# Patient Record
Sex: Female | Born: 1960 | Race: White | Hispanic: No | Marital: Married | Smoking: Former smoker
Health system: Southern US, Community
[De-identification: ages and names within clinical notes are randomized; demographics above are authoritative.]

## PROBLEM LIST (undated history)

## (undated) DIAGNOSIS — G4700 Insomnia, unspecified: Secondary | ICD-10-CM

## (undated) DIAGNOSIS — F909 Attention-deficit hyperactivity disorder, unspecified type: Secondary | ICD-10-CM

## (undated) DIAGNOSIS — M1990 Unspecified osteoarthritis, unspecified site: Secondary | ICD-10-CM

## (undated) DIAGNOSIS — I10 Essential (primary) hypertension: Secondary | ICD-10-CM

## (undated) HISTORY — PX: URETHRAL FISTULA REPAIR: SHX2619

## (undated) HISTORY — DX: Unspecified osteoarthritis, unspecified site: M19.90

## (undated) HISTORY — PX: SHOULDER SURGERY: SHX246

## (undated) HISTORY — PX: CATARACT EXTRACTION: SUR2

## (undated) HISTORY — PX: CERVICAL FUSION: SHX112

## (undated) HISTORY — PX: TONSILLECTOMY: SUR1361

## (undated) HISTORY — PX: WISDOM TOOTH EXTRACTION: SHX21

## (undated) HISTORY — PX: BREAST ENHANCEMENT SURGERY: SHX7

## (undated) HISTORY — DX: Insomnia, unspecified: G47.00

## (undated) HISTORY — PX: HERNIA REPAIR: SHX51

## (undated) HISTORY — PX: LUMBAR FUSION: SHX111

## (undated) HISTORY — PX: RECONSTRUCTION OF EYELID: SHX6576

---

## 1997-10-20 ENCOUNTER — Other Ambulatory Visit: Admission: RE | Admit: 1997-10-20 | Discharge: 1997-10-20 | Payer: Self-pay | Admitting: Obstetrics and Gynecology

## 1998-09-13 ENCOUNTER — Other Ambulatory Visit: Admission: RE | Admit: 1998-09-13 | Discharge: 1998-09-13 | Payer: Self-pay | Admitting: Obstetrics and Gynecology

## 1998-10-15 ENCOUNTER — Ambulatory Visit (HOSPITAL_COMMUNITY): Admission: RE | Admit: 1998-10-15 | Discharge: 1998-10-15 | Payer: Self-pay | Admitting: *Deleted

## 1998-10-15 ENCOUNTER — Encounter: Payer: Self-pay | Admitting: *Deleted

## 1999-07-25 ENCOUNTER — Ambulatory Visit (HOSPITAL_COMMUNITY): Admission: RE | Admit: 1999-07-25 | Discharge: 1999-07-25 | Payer: Self-pay | Admitting: Obstetrics and Gynecology

## 1999-10-31 ENCOUNTER — Ambulatory Visit (HOSPITAL_COMMUNITY): Admission: RE | Admit: 1999-10-31 | Discharge: 1999-10-31 | Payer: Self-pay | Admitting: Obstetrics and Gynecology

## 2000-01-26 ENCOUNTER — Inpatient Hospital Stay (HOSPITAL_COMMUNITY): Admission: AD | Admit: 2000-01-26 | Discharge: 2000-01-29 | Payer: Self-pay | Admitting: Obstetrics and Gynecology

## 2000-01-26 ENCOUNTER — Encounter (INDEPENDENT_AMBULATORY_CARE_PROVIDER_SITE_OTHER): Payer: Self-pay

## 2000-02-22 ENCOUNTER — Other Ambulatory Visit: Admission: RE | Admit: 2000-02-22 | Discharge: 2000-02-22 | Payer: Self-pay | Admitting: Obstetrics & Gynecology

## 2000-06-28 ENCOUNTER — Observation Stay (HOSPITAL_COMMUNITY): Admission: RE | Admit: 2000-06-28 | Discharge: 2000-06-29 | Payer: Self-pay | Admitting: Obstetrics and Gynecology

## 2000-06-28 ENCOUNTER — Encounter (INDEPENDENT_AMBULATORY_CARE_PROVIDER_SITE_OTHER): Payer: Self-pay

## 2001-10-10 ENCOUNTER — Emergency Department (HOSPITAL_COMMUNITY): Admission: EM | Admit: 2001-10-10 | Discharge: 2001-10-10 | Payer: Self-pay | Admitting: Emergency Medicine

## 2002-07-03 ENCOUNTER — Other Ambulatory Visit: Admission: RE | Admit: 2002-07-03 | Discharge: 2002-07-03 | Payer: Self-pay | Admitting: Obstetrics and Gynecology

## 2002-10-14 ENCOUNTER — Other Ambulatory Visit: Admission: RE | Admit: 2002-10-14 | Discharge: 2002-10-14 | Payer: Self-pay | Admitting: Obstetrics and Gynecology

## 2003-02-12 ENCOUNTER — Other Ambulatory Visit: Admission: RE | Admit: 2003-02-12 | Discharge: 2003-02-12 | Payer: Self-pay | Admitting: Obstetrics and Gynecology

## 2003-07-13 ENCOUNTER — Other Ambulatory Visit: Admission: RE | Admit: 2003-07-13 | Discharge: 2003-07-13 | Payer: Self-pay | Admitting: Obstetrics and Gynecology

## 2004-12-27 ENCOUNTER — Other Ambulatory Visit: Admission: RE | Admit: 2004-12-27 | Discharge: 2004-12-27 | Payer: Self-pay | Admitting: Obstetrics and Gynecology

## 2005-08-11 ENCOUNTER — Encounter: Admission: RE | Admit: 2005-08-11 | Discharge: 2005-08-11 | Payer: Self-pay | Admitting: Neurosurgery

## 2005-12-09 ENCOUNTER — Encounter: Admission: RE | Admit: 2005-12-09 | Discharge: 2005-12-09 | Payer: Self-pay | Admitting: Neurosurgery

## 2006-01-02 ENCOUNTER — Ambulatory Visit (HOSPITAL_COMMUNITY): Admission: RE | Admit: 2006-01-02 | Discharge: 2006-01-03 | Payer: Self-pay | Admitting: Neurosurgery

## 2006-02-07 ENCOUNTER — Encounter: Admission: RE | Admit: 2006-02-07 | Discharge: 2006-02-07 | Payer: Self-pay | Admitting: Sports Medicine

## 2006-02-28 ENCOUNTER — Encounter: Admission: RE | Admit: 2006-02-28 | Discharge: 2006-02-28 | Payer: Self-pay | Admitting: Sports Medicine

## 2006-06-18 ENCOUNTER — Encounter: Admission: RE | Admit: 2006-06-18 | Discharge: 2006-06-18 | Payer: Self-pay | Admitting: Neurosurgery

## 2008-01-09 ENCOUNTER — Encounter: Admission: RE | Admit: 2008-01-09 | Discharge: 2008-01-09 | Payer: Self-pay | Admitting: Neurosurgery

## 2009-09-30 ENCOUNTER — Ambulatory Visit (HOSPITAL_COMMUNITY): Admission: RE | Admit: 2009-09-30 | Discharge: 2009-09-30 | Payer: Self-pay | Admitting: Obstetrics and Gynecology

## 2010-01-04 ENCOUNTER — Encounter: Admission: RE | Admit: 2010-01-04 | Discharge: 2010-01-04 | Payer: Self-pay | Admitting: Emergency Medicine

## 2010-01-05 ENCOUNTER — Encounter: Admission: RE | Admit: 2010-01-05 | Discharge: 2010-01-05 | Payer: Self-pay | Admitting: Emergency Medicine

## 2010-02-26 ENCOUNTER — Encounter: Payer: Self-pay | Admitting: Neurosurgery

## 2010-03-15 ENCOUNTER — Other Ambulatory Visit: Payer: Self-pay | Admitting: Internal Medicine

## 2010-03-16 ENCOUNTER — Other Ambulatory Visit: Payer: Self-pay | Admitting: Internal Medicine

## 2010-03-16 DIAGNOSIS — M545 Low back pain, unspecified: Secondary | ICD-10-CM

## 2010-03-16 DIAGNOSIS — M79606 Pain in leg, unspecified: Secondary | ICD-10-CM

## 2010-03-21 ENCOUNTER — Ambulatory Visit
Admission: RE | Admit: 2010-03-21 | Discharge: 2010-03-21 | Disposition: A | Discharge status: Home / Self Care | Payer: BC Managed Care – PPO | Source: Ambulatory Visit | Attending: Internal Medicine | Admitting: Internal Medicine

## 2010-03-21 DIAGNOSIS — M79606 Pain in leg, unspecified: Secondary | ICD-10-CM

## 2010-03-21 DIAGNOSIS — M545 Low back pain, unspecified: Secondary | ICD-10-CM

## 2010-04-21 LAB — CBC
Hemoglobin: 14 g/dL (ref 12.0–15.0)
MCH: 32.3 pg (ref 26.0–34.0)
MCV: 96 fL (ref 78.0–100.0)
RBC: 4.34 MIL/uL (ref 3.87–5.11)

## 2010-06-24 NOTE — Op Note (Signed)
Beverly Dougherty, Beverly Dougherty              ACCOUNT NO.:  [id number]   MEDICAL RECORD NO.:  [id number]          PATIENT TYPE:  AMB   LOCATION:  SDS                          FACILITY:  [facility]   PHYSICIAN:  Reinaldo Meeker, M.D. DATE OF BIRTH:  09-Jul-1960   DATE OF PROCEDURE:  01/02/2006  DATE OF DISCHARGE:                                 OPERATIVE REPORT   PREOPERATIVE DIAGNOSIS:  Herniated disk, C6-7 left.   POSTOPERATIVE DIAGNOSIS:  Herniated disk, C6-7 left.   PROCEDURE:  C6-7 anterior cervical diskectomy with bone bank fusion followed  by the Mystique anterior cervical plating.   SURGEON:  Reinaldo Meeker, M.D.   ASSISTANT:  Dr. Julio Sicks.   PROCEDURE IN DETAIL:  After placed in supine position, 5 pounds halter  traction, the patient's neck was prepped and draped in the usual sterile  fashion.  Localizing fluoroscopy was used prior to incision to identify the  appropriate level.  The transverse incision was made in the right anterior  neck starting at the midline and headed towards the medial aspect of the  sternocleidomastoid muscle.  The platysma muscle was incised transversely.  The natural fascial plane between the strap muscles medially and  sternocleidomastoid laterally was identified and followed down to the  anterior aspect of the cervical spine.  Longus coli muscles were identified,  split in the midline stripped away bilaterally with Management consultant.  Self-retaining retractor was placed for exposure and x-rays  showed approach to the appropriate level.  Using a 15 blade the annulus of  the disk at C6-7 was incised.  Using pituitary rongeurs and curettes,  approximately 90% of the disk material was removed.  High-speed drill was  used to widen the interspace.  Microscope was draped brought the field and  used for the remainder of the case.  Using microsection technique, the  remainder of the disk material down the post longitudinal ligament was  removed.   Ligament was then incised transversely and the cut edge was  removed with Kerrison punch.  Large amount of herniated disk material was  identified towards the left side, compressing the C7 nerve root and this was  removed until the C7 nerve root was well decompressed as it went out its  foramen.  Similar decompression was then carried out.  The width of the  spinal dura towards the proximal foramen on the right.  At this time,  inspection was carried out all directions for any evidence of residual  compression and none could be identified.  Large amounts of irrigation were  carried out and any bleeding controlled bipolar coagulation and Gelfoam.  Measurements were taken and a 7 mm bone bank plugs reconstituted.  After  irrigating once more and confirming hemostasis the plug was impacted  difficulty and fluoroscopy showed it to be good position.  An appropriate  length Mystique anterior cervical plate was then chosen by using a template.  Under fluoroscopic guidance, drill holes were placed followed by tapping and  placing 13 mm screws x4 until the screws were secured in the plate.  Irrigation was  then carried out once more final fluoroscopy showed the plate  screws and plug to all be in good position.  Any bleeding was  controlled bipolar coagulation.  Wound was then closed with interrupted  Vicryl on the platysma muscle, inverted 5-0 PDS on the subcuticular layer  and Steri-Strips on the skin.  A sterile dressing and soft collar were  applied.  The patient was extubated and taken to recovery room in stable  condition.           ______________________________  Reinaldo Meeker, M.D.     ROK/MEDQ  D:  01/02/2006  T:  01/02/2006  Job:  [id number]

## 2010-06-24 NOTE — Discharge Summary (Signed)
[facility]  Patient:    Beverly Dougherty, Beverly Dougherty                     MRN: [id number] Adm. Date:  [id number] Disc. Date: [id number] Attending:  Miguel Aschoff Dictator:   Leilani Able, P.A.                           Discharge Summary  FINAL DIAGNOSES:              Vacuum assisted vaginal delivery of a female infant with Apgars of 4 and 9.  HISTORY:                      This 50 year old G1, P0 was admitted for induction at 40 2/7 weeks.  Patient was admitted on the 20th for Cytotec.  By the next morning patient was having good labor with Cytotec.  Her cervix was already 3-4 cm dilated, 100% effaced, at a 0 station.  AROM was performed which did show thick meconium and amnio-infusion was done.  Patient dilated to complete and complete.  Decelerations were starting to be noted in the second stage of labor and at this point a vacuum extractor was placed.  Patient had delivery of a 6 pound 10 ounce female infant with Apgars of 4 and 9 over a second degree perineal laceration.  Pediatricians were in attendance.  There was a long cord noted but the delivery went without complications.  Patients postpartum course was benign without significant fevers.  She was felt ready for discharge on postpartum #2.  Was sent home on a regular diet.  Told to decrease activities.  Told to continue prenatal vitamins.  Was given over-the-counter pain medicines and was told to follow-up in the office in four weeks. DD:  02/27/00 TD:  02/27/00 Job: [id number]

## 2010-06-24 NOTE — Op Note (Signed)
[facility]  Patient:    JANEVA, PEASTER                     MRN: [id number] Proc. Date: 06/28/00 Adm. Date:  [id number] Disc. Date: [id number] Attending:  Osborn Coho                           Operative Report  PREOPERATIVE DIAGNOSES:       1. Symptomatic rectovaginal fistula.                               2. Dyspareunia.  POSTOPERATIVE DIAGNOSES:      1. Symptomatic rectovaginal fistula.                               2. Dyspareunia.  OPERATION:                    Repair of rectovaginal fistula.  SURGEON:                      Mark E. Dareen Piano, M.D.  ANESTHESIA:                   General.  ANTIBIOTICS:                  Ancef 1 g.  DRAINS:                       None.  ESTIMATED BLOOD LOSS:         50 cc.  COMPLICATIONS:                None.  SPECIMENS:                    None.  DESCRIPTION OF PROCEDURE:     The patient was taken to the operating room where general anesthetic was administered without complications.  She was then placed in the dorsolithotomy position and prepped with Hibiclens.  Her bladder was drained with a red rubber catheter.  She was then draped in the usual fashion for this procedure.  Her fistula was identified in the perineum in the midline approximately half way between the anus and the vestibule.   At this point, lacrimal probes were used in attempt to probe the fistula.  This was unsuccessful.  At that point, the fistula was circumscribed and followed down to an area just posterior to the anal sphincter.  At this point, an area in the rectum was opened and the fistula excised.  The rectal mucosa was then closed in three layers of 0 chromic suture.  The perirectal fascia was reapproximated in the midline using 3-0 chromic in a running fashion.  Several layers of 4-0 Vicryl suture were also placed followed by a running 4-0 Rapide suture to close the remaining area in a fashion similar to an  episiotomy repair.  At this point the procedure was concluded.  The patient was taken to the recovery room in stable condition.  Instrument and lap counts were correct x 1. DD:  06/29/00 TD:  06/30/00 Job: [id number]

## 2011-03-29 ENCOUNTER — Other Ambulatory Visit: Payer: Self-pay | Admitting: Family Medicine

## 2011-03-29 DIAGNOSIS — M7070 Other bursitis of hip, unspecified hip: Secondary | ICD-10-CM

## 2011-03-29 DIAGNOSIS — M5431 Sciatica, right side: Secondary | ICD-10-CM

## 2011-04-04 ENCOUNTER — Ambulatory Visit
Admission: RE | Admit: 2011-04-04 | Discharge: 2011-04-04 | Disposition: A | Discharge status: Home / Self Care | Payer: BC Managed Care – PPO | Source: Ambulatory Visit | Attending: Family Medicine | Admitting: Family Medicine

## 2011-04-04 DIAGNOSIS — M5431 Sciatica, right side: Secondary | ICD-10-CM

## 2011-04-07 ENCOUNTER — Other Ambulatory Visit: Payer: BC Managed Care – PPO

## 2011-04-26 ENCOUNTER — Ambulatory Visit
Admission: RE | Admit: 2011-04-26 | Discharge: 2011-04-26 | Disposition: A | Discharge status: Home / Self Care | Payer: BC Managed Care – PPO | Source: Ambulatory Visit | Attending: Family Medicine | Admitting: Family Medicine

## 2011-04-26 DIAGNOSIS — M7070 Other bursitis of hip, unspecified hip: Secondary | ICD-10-CM

## 2011-04-26 MED ORDER — IOHEXOL 180 MG/ML  SOLN
1.0000 mL | Freq: Once | INTRAMUSCULAR | Status: AC | PRN
  Administered 2011-04-26: 1 mL

## 2011-04-26 MED ORDER — METHYLPREDNISOLONE ACETATE 40 MG/ML INJ SUSP (RADIOLOG
120.0000 mg | Freq: Once | INTRAMUSCULAR | Status: AC
  Administered 2011-04-26: 120 mg via INTRAMUSCULAR

## 2011-05-16 ENCOUNTER — Other Ambulatory Visit: Payer: Self-pay | Admitting: Family Medicine

## 2011-05-16 DIAGNOSIS — M7070 Other bursitis of hip, unspecified hip: Secondary | ICD-10-CM

## 2011-05-24 ENCOUNTER — Ambulatory Visit
Admission: RE | Admit: 2011-05-24 | Discharge: 2011-05-24 | Disposition: A | Discharge status: Home / Self Care | Payer: BC Managed Care – PPO | Source: Ambulatory Visit | Attending: Family Medicine | Admitting: Family Medicine

## 2011-05-24 DIAGNOSIS — M7070 Other bursitis of hip, unspecified hip: Secondary | ICD-10-CM

## 2011-05-24 MED ORDER — IOHEXOL 180 MG/ML  SOLN
1.0000 mL | Freq: Once | INTRAMUSCULAR | Status: AC | PRN
  Administered 2011-05-24: 1 mL

## 2011-05-24 MED ORDER — METHYLPREDNISOLONE ACETATE 40 MG/ML INJ SUSP (RADIOLOG
120.0000 mg | Freq: Once | INTRAMUSCULAR | Status: AC
  Administered 2011-05-24: 120 mg via INTRALESIONAL

## 2011-09-18 ENCOUNTER — Other Ambulatory Visit: Payer: Self-pay | Admitting: Family Medicine

## 2011-09-18 DIAGNOSIS — M7070 Other bursitis of hip, unspecified hip: Secondary | ICD-10-CM

## 2011-09-26 ENCOUNTER — Ambulatory Visit
Admission: RE | Admit: 2011-09-26 | Discharge: 2011-09-26 | Disposition: A | Discharge status: Home / Self Care | Payer: BC Managed Care – PPO | Source: Ambulatory Visit | Attending: Family Medicine | Admitting: Family Medicine

## 2011-09-26 DIAGNOSIS — M7070 Other bursitis of hip, unspecified hip: Secondary | ICD-10-CM

## 2011-09-26 MED ORDER — METHYLPREDNISOLONE ACETATE 40 MG/ML INJ SUSP (RADIOLOG
120.0000 mg | Freq: Once | INTRAMUSCULAR | Status: AC
  Administered 2011-09-26: 120 mg via INTRAMUSCULAR

## 2011-09-26 MED ORDER — IOHEXOL 180 MG/ML  SOLN
1.0000 mL | Freq: Once | INTRAMUSCULAR | Status: AC | PRN
  Administered 2011-09-26: 1 mL

## 2011-10-11 ENCOUNTER — Other Ambulatory Visit: Payer: Self-pay | Admitting: Family Medicine

## 2011-10-11 DIAGNOSIS — M7070 Other bursitis of hip, unspecified hip: Secondary | ICD-10-CM

## 2011-10-16 ENCOUNTER — Ambulatory Visit
Admission: RE | Admit: 2011-10-16 | Discharge: 2011-10-16 | Disposition: A | Discharge status: Home / Self Care | Payer: BC Managed Care – PPO | Source: Ambulatory Visit | Attending: Family Medicine | Admitting: Family Medicine

## 2011-10-16 DIAGNOSIS — M7070 Other bursitis of hip, unspecified hip: Secondary | ICD-10-CM

## 2011-10-16 MED ORDER — METHYLPREDNISOLONE ACETATE 40 MG/ML INJ SUSP (RADIOLOG
120.0000 mg | Freq: Once | INTRAMUSCULAR | Status: AC
  Administered 2011-10-16: 120 mg via INTRA_ARTICULAR

## 2011-10-16 MED ORDER — IOHEXOL 180 MG/ML  SOLN
1.0000 mL | Freq: Once | INTRAMUSCULAR | Status: AC | PRN
  Administered 2011-10-16: 1 mL

## 2011-10-20 ENCOUNTER — Other Ambulatory Visit: Payer: Self-pay | Admitting: Chiropractic Medicine

## 2011-10-20 DIAGNOSIS — M542 Cervicalgia: Secondary | ICD-10-CM

## 2011-10-31 ENCOUNTER — Other Ambulatory Visit: Payer: Self-pay | Admitting: Chiropractic Medicine

## 2011-10-31 DIAGNOSIS — M542 Cervicalgia: Secondary | ICD-10-CM

## 2011-11-02 ENCOUNTER — Ambulatory Visit
Admission: RE | Admit: 2011-11-02 | Discharge: 2011-11-02 | Disposition: A | Discharge status: Home / Self Care | Payer: BC Managed Care – PPO | Source: Ambulatory Visit | Attending: Chiropractic Medicine | Admitting: Chiropractic Medicine

## 2011-11-02 ENCOUNTER — Other Ambulatory Visit: Payer: Self-pay

## 2011-11-02 DIAGNOSIS — M542 Cervicalgia: Secondary | ICD-10-CM

## 2013-02-26 ENCOUNTER — Encounter: Payer: Self-pay | Admitting: Gastroenterology

## 2013-03-19 ENCOUNTER — Ambulatory Visit (AMBULATORY_SURGERY_CENTER): Payer: BC Managed Care – PPO

## 2013-03-19 VITALS — Ht 65.5 in | Wt 130.0 lb

## 2013-03-19 DIAGNOSIS — Z1211 Encounter for screening for malignant neoplasm of colon: Secondary | ICD-10-CM

## 2013-03-19 MED ORDER — MOVIPREP 100 G PO SOLR: 1.0000 | Freq: Once | ORAL | Status: DC

## 2013-03-25 ENCOUNTER — Encounter: Payer: Self-pay | Admitting: Gastroenterology

## 2013-04-02 ENCOUNTER — Encounter: Payer: BC Managed Care – PPO | Admitting: Gastroenterology

## 2013-04-10 ENCOUNTER — Ambulatory Visit (AMBULATORY_SURGERY_CENTER): Payer: BC Managed Care – PPO | Admitting: Gastroenterology

## 2013-04-10 ENCOUNTER — Encounter: Payer: Self-pay | Admitting: Gastroenterology

## 2013-04-10 VITALS — BP 119/94 | HR 57 | Temp 98.5°F | Resp 14 | Ht 65.0 in | Wt 130.0 lb

## 2013-04-10 DIAGNOSIS — D126 Benign neoplasm of colon, unspecified: Secondary | ICD-10-CM

## 2013-04-10 DIAGNOSIS — Z1211 Encounter for screening for malignant neoplasm of colon: Secondary | ICD-10-CM

## 2013-04-10 MED ORDER — SODIUM CHLORIDE 0.9 % IV SOLN: 500.0000 mL | INTRAVENOUS | Status: DC

## 2013-04-10 NOTE — Progress Notes (Signed)
Report to pacu rn, vss, bbs=clear 

## 2013-04-10 NOTE — Patient Instructions (Signed)
YOU HAD AN ENDOSCOPIC PROCEDURE TODAY AT THE [facility]: Refer to the procedure report that was given to you for any specific questions about what was found during the examination.  If the procedure report does not answer your questions, please call your gastroenterologist to clarify.  If you requested that your care partner not be given the details of your procedure findings, then the procedure report has been included in a sealed envelope for you to review at your convenience later.  YOU SHOULD EXPECT: Some feelings of bloating in the abdomen. Passage of more gas than usual.  Walking can help get rid of the air that was put into your GI tract during the procedure and reduce the bloating. If you had a lower endoscopy (such as a colonoscopy or flexible sigmoidoscopy) you may notice spotting of blood in your stool or on the toilet paper. If you underwent a bowel prep for your procedure, then you may not have a normal bowel movement for a few days.  DIET: Your first meal following the procedure should be a light meal and then it is ok to progress to your normal diet.  A half-sandwich or bowl of soup is an example of a good first meal.  Heavy or fried foods are harder to digest and may make you feel nauseous or bloated.  Likewise meals heavy in dairy and vegetables can cause extra gas to form and this can also increase the bloating.  Drink plenty of fluids but you should avoid alcoholic beverages for 24 hours.  ACTIVITY: Your care partner should take you home directly after the procedure.  You should plan to take it easy, moving slowly for the rest of the day.  You can resume normal activity the day after the procedure however you should NOT DRIVE or use heavy machinery for 24 hours (because of the sedation medicines used during the test).    SYMPTOMS TO REPORT IMMEDIATELY: A gastroenterologist can be reached at any hour.  During normal business hours, 8:30 AM to 5:00 PM Monday through Friday,  call [phone].  After hours and on weekends, please call the GI answering service at [phone] who will take a message and have the physician on call contact you.   Following lower endoscopy (colonoscopy or flexible sigmoidoscopy):  Excessive amounts of blood in the stool  Significant tenderness or worsening of abdominal pains  Swelling of the abdomen that is new, acute  Fever of 100F or higher  FOLLOW UP: If any biopsies were taken you will be contacted by phone or by letter within the next 1-3 weeks.  Call your gastroenterologist if you have not heard about the biopsies in 3 weeks.  Our staff will call the home number listed on your records the next business day following your procedure to check on you and address any questions or concerns that you may have at that time regarding the information given to you following your procedure. This is a courtesy call and so if there is no answer at the home number and we have not heard from you through the emergency physician on call, we will assume that you have returned to your regular daily activities without incident.  SIGNATURES/CONFIDENTIALITY: You and/or your care partner have signed paperwork which will be entered into your electronic medical record.  These signatures attest to the fact that that the information above on your After Visit Summary has been reviewed and is understood.  Full responsibility of the confidentiality of this   discharge information lies with you and/or your care-partner.  Please read over handouts about polyps, diverticulosis, and high fiber diets  Await pathology  Continue your current medications

## 2013-04-10 NOTE — Op Note (Signed)
[place]  [address] [place], [address]   COLONOSCOPY PROCEDURE REPORT  PATIENT: Dougherty, Beverly  MR#: [id number] BIRTHDATE: July 21, 1960 , 52  yrs. old GENDER: Female ENDOSCOPIST: Ladene Artist, MD, [facility] REFERRED JQ:[id number] Sandi Mariscal, M.D.  Freda Munro, M.D. PROCEDURE DATE:  04/10/2013 PROCEDURE:   Colonoscopy with biopsy and snare polypectomy First Screening Colonoscopy - Avg.  risk and is 50 yrs.  old or older Yes.  Prior Negative Screening - Now for repeat screening. N/A  History of Adenoma - Now for follow-up colonoscopy & has been > or = to 3 yrs.  N/A  Polyps Removed Today? Yes. ASA CLASS:   Class II INDICATIONS:average risk screening. MEDICATIONS: MAC sedation, administered by CRNA and propofol (Diprivan) 570mg  IV DESCRIPTION OF PROCEDURE:   After the risks benefits and alternatives of the procedure were thoroughly explained, informed consent was obtained.  A digital rectal exam revealed no abnormalities of the rectum.   The LB [id number]  endoscope was introduced through the anus and advanced to the cecum, which was identified by both the appendix and ileocecal valve. No adverse events experienced.   The quality of the prep was excellent, using MoviPrep  The instrument was then slowly withdrawn as the colon was fully examined.  COLON FINDINGS: A sessile polyp measuring 6 mm in size was found at the ileocecal valve.  A polypectomy was performed with a cold snare.  The resection was complete and the polyp tissue was completely retrieved.   A sessile polyp measuring 4 mm in size was found in the sigmoid colon.  A polypectomy was performed with cold forceps.  The resection was complete and the polyp tissue was completely retrieved.  Mild diverticulosis was noted in the sigmoid colon.  The colon was otherwise normal.  There was no diverticulosis, inflammation, polyps or cancers unless previously stated.  Retroflexed views revealed no  abnormalities. The time to cecum=4 minutes 56 seconds.  Withdrawal time=17 minutes 36 seconds. The scope was withdrawn and the procedure completed. COMPLICATIONS: There were no complications.  ENDOSCOPIC IMPRESSION: 1.   Sessile polyp measuring 6 mm at the ileocecal valve; polypectomy performed with a cold snare 2.   Sessile polyp measuring 4 mm in the sigmoid colon; polypectomy performed with cold forceps 3.   Mild diverticulosis in the sigmoid colon  RECOMMENDATIONS: 1.  Await pathology results 2.  Repeat colonoscopy in 5 years if polyp(s) adenomatous; otherwise 10 years 3.  High fiber diet with liberal fluid intake.  eSigned:  Ladene Artist, MD, [facility] 04/10/2013 2:33 PM

## 2013-04-10 NOTE — Progress Notes (Signed)
Called to room to assist during endoscopic procedure.  Patient ID and intended procedure confirmed with present staff. Received instructions for my participation in the procedure from the performing physician.  

## 2013-04-11 ENCOUNTER — Telehealth: Payer: Self-pay

## 2013-04-11 NOTE — Telephone Encounter (Signed)
  Follow up Call-  Call back number 04/10/2013  Post procedure Call Back phone  # [phone]  Permission to leave phone message Yes     Patient questions:  Do you have a fever, pain , or abdominal swelling? no Pain Score  0 *  Have you tolerated food without any problems? yes  Have you been able to return to your normal activities? yes  Do you have any questions about your discharge instructions: Diet   no Medications  no Follow up visit  no  Do you have questions or concerns about your Care? no  Actions: * If pain score is 4 or above: No action needed, pain <4.

## 2013-04-17 ENCOUNTER — Encounter: Payer: Self-pay | Admitting: Gastroenterology

## 2013-10-27 ENCOUNTER — Other Ambulatory Visit: Payer: Self-pay | Admitting: Orthopaedic Surgery

## 2013-10-27 ENCOUNTER — Other Ambulatory Visit: Payer: Self-pay | Admitting: *Deleted

## 2013-10-27 DIAGNOSIS — M542 Cervicalgia: Secondary | ICD-10-CM

## 2013-10-30 ENCOUNTER — Ambulatory Visit
Admission: RE | Admit: 2013-10-30 | Discharge: 2013-10-30 | Disposition: A | Discharge status: Home / Self Care | Payer: BC Managed Care – PPO | Source: Ambulatory Visit | Attending: Orthopaedic Surgery | Admitting: Orthopaedic Surgery

## 2013-10-30 DIAGNOSIS — M542 Cervicalgia: Secondary | ICD-10-CM

## 2013-11-17 ENCOUNTER — Other Ambulatory Visit: Payer: Self-pay | Admitting: Rehabilitation

## 2013-11-17 DIAGNOSIS — M542 Cervicalgia: Secondary | ICD-10-CM

## 2013-11-17 DIAGNOSIS — M545 Low back pain, unspecified: Secondary | ICD-10-CM

## 2013-11-20 ENCOUNTER — Ambulatory Visit
Admission: RE | Admit: 2013-11-20 | Discharge: 2013-11-20 | Disposition: A | Discharge status: Home / Self Care | Payer: BC Managed Care – PPO | Source: Ambulatory Visit | Attending: Rehabilitation | Admitting: Rehabilitation

## 2013-11-20 DIAGNOSIS — M545 Low back pain, unspecified: Secondary | ICD-10-CM

## 2013-11-20 DIAGNOSIS — M542 Cervicalgia: Secondary | ICD-10-CM

## 2013-12-01 ENCOUNTER — Other Ambulatory Visit: Payer: Self-pay | Admitting: Family Medicine

## 2013-12-01 DIAGNOSIS — T1490XA Injury, unspecified, initial encounter: Secondary | ICD-10-CM

## 2013-12-03 ENCOUNTER — Other Ambulatory Visit: Payer: BC Managed Care – PPO

## 2013-12-03 ENCOUNTER — Ambulatory Visit
Admission: RE | Admit: 2013-12-03 | Discharge: 2013-12-03 | Disposition: A | Discharge status: Home / Self Care | Payer: BC Managed Care – PPO | Source: Ambulatory Visit | Attending: Family Medicine | Admitting: Family Medicine

## 2013-12-03 ENCOUNTER — Ambulatory Visit
Admission: RE | Admit: 2013-12-03 | Discharge: 2013-12-03 | Disposition: A | Discharge status: Home / Self Care | Payer: BC Managed Care – PPO | Source: Ambulatory Visit | Attending: Rehabilitation | Admitting: Rehabilitation

## 2013-12-03 DIAGNOSIS — T1490XA Injury, unspecified, initial encounter: Secondary | ICD-10-CM

## 2014-04-01 ENCOUNTER — Other Ambulatory Visit: Payer: Self-pay | Admitting: Obstetrics and Gynecology

## 2014-04-02 LAB — CYTOLOGY - PAP

## 2014-11-12 ENCOUNTER — Ambulatory Visit
Admission: RE | Admit: 2014-11-12 | Discharge: 2014-11-12 | Disposition: A | Discharge status: Home / Self Care | Payer: BLUE CROSS/BLUE SHIELD | Source: Ambulatory Visit | Attending: Family Medicine | Admitting: Family Medicine

## 2014-11-12 ENCOUNTER — Other Ambulatory Visit: Payer: Self-pay | Admitting: Family Medicine

## 2014-11-12 DIAGNOSIS — M25531 Pain in right wrist: Secondary | ICD-10-CM

## 2014-11-12 DIAGNOSIS — R609 Edema, unspecified: Secondary | ICD-10-CM

## 2015-04-17 ENCOUNTER — Encounter (HOSPITAL_BASED_OUTPATIENT_CLINIC_OR_DEPARTMENT_OTHER): Payer: Self-pay | Admitting: Emergency Medicine

## 2015-04-17 DIAGNOSIS — G4700 Insomnia, unspecified: Secondary | ICD-10-CM | POA: Diagnosis not present

## 2015-04-17 DIAGNOSIS — Z3202 Encounter for pregnancy test, result negative: Secondary | ICD-10-CM | POA: Diagnosis not present

## 2015-04-17 DIAGNOSIS — Z79899 Other long term (current) drug therapy: Secondary | ICD-10-CM | POA: Insufficient documentation

## 2015-04-17 DIAGNOSIS — R140 Abdominal distension (gaseous): Secondary | ICD-10-CM | POA: Diagnosis not present

## 2015-04-17 DIAGNOSIS — M1990 Unspecified osteoarthritis, unspecified site: Secondary | ICD-10-CM | POA: Insufficient documentation

## 2015-04-17 DIAGNOSIS — Z792 Long term (current) use of antibiotics: Secondary | ICD-10-CM | POA: Insufficient documentation

## 2015-04-17 DIAGNOSIS — R1084 Generalized abdominal pain: Secondary | ICD-10-CM | POA: Diagnosis not present

## 2015-04-17 DIAGNOSIS — Z791 Long term (current) use of non-steroidal anti-inflammatories (NSAID): Secondary | ICD-10-CM | POA: Insufficient documentation

## 2015-04-17 DIAGNOSIS — Z87891 Personal history of nicotine dependence: Secondary | ICD-10-CM | POA: Diagnosis not present

## 2015-04-17 LAB — COMPREHENSIVE METABOLIC PANEL
ALBUMIN: 3.8 g/dL (ref 3.5–5.0)
ALT: 21 U/L (ref 14–54)
ANION GAP: 9 (ref 5–15)
AST: 32 U/L (ref 15–41)
Alkaline Phosphatase: 74 U/L (ref 38–126)
BUN: 8 mg/dL (ref 6–20)
CALCIUM: 8.5 mg/dL — AB (ref 8.9–10.3)
CO2: 26 mmol/L (ref 22–32)
CREATININE: 0.67 mg/dL (ref 0.44–1.00)
Chloride: 102 mmol/L (ref 101–111)
GFR calc Af Amer: >60 mL/min (ref >60)
GFR calc non Af Amer: >60 mL/min (ref >60)
GLUCOSE: 103 mg/dL — AB (ref 65–99)
Potassium: 4.3 mmol/L (ref 3.5–5.1)
SODIUM: 137 mmol/L (ref 135–145)
Total Bilirubin: 0.5 mg/dL (ref 0.3–1.2)
Total Protein: 7.4 g/dL (ref 6.5–8.1)

## 2015-04-17 LAB — URINALYSIS, ROUTINE W REFLEX MICROSCOPIC
Bilirubin Urine: NEGATIVE
Glucose, UA: NEGATIVE mg/dL
HGB URINE DIPSTICK: NEGATIVE
Ketones, ur: NEGATIVE mg/dL
Leukocytes, UA: NEGATIVE
Nitrite: NEGATIVE
PH: 5.5 (ref 5.0–8.0)
Protein, ur: NEGATIVE mg/dL
Specific Gravity, Urine: 1.005 (ref 1.005–1.030)

## 2015-04-17 LAB — CBC
HCT: 37.8 % (ref 36.0–46.0)
HEMOGLOBIN: 12.3 g/dL (ref 12.0–15.0)
MCH: 31.5 pg (ref 26.0–34.0)
MCHC: 32.5 g/dL (ref 30.0–36.0)
MCV: 96.7 fL (ref 78.0–100.0)
Platelets: 196 10*3/uL (ref 150–400)
RBC: 3.91 MIL/uL (ref 3.87–5.11)
RDW: 12 % (ref 11.5–15.5)
WBC: 7.6 10*3/uL (ref 4.0–10.5)

## 2015-04-17 LAB — PREGNANCY, URINE: PREG TEST UR: NEGATIVE

## 2015-04-17 LAB — LIPASE, BLOOD: Lipase: 22 U/L (ref 11–51)

## 2015-04-17 NOTE — ED Notes (Signed)
Patient reports that she just got of the plane from [place]. Was seen and treated there in [place] with antibiotics and pain meds. Patient states that she was told to come here because she continued to have pain. PAtient reports that she has pain all across her upper quadrants. The patient denies any N/V/D. Reports that she has a fever yesteday

## 2015-04-18 ENCOUNTER — Emergency Department (HOSPITAL_BASED_OUTPATIENT_CLINIC_OR_DEPARTMENT_OTHER)
Admission: EM | Admit: 2015-04-18 | Discharge: 2015-04-18 | Disposition: A | Discharge status: Home / Self Care | Payer: BLUE CROSS/BLUE SHIELD | Attending: Emergency Medicine | Admitting: Emergency Medicine

## 2015-04-18 ENCOUNTER — Emergency Department (HOSPITAL_BASED_OUTPATIENT_CLINIC_OR_DEPARTMENT_OTHER): Payer: BLUE CROSS/BLUE SHIELD

## 2015-04-18 DIAGNOSIS — R1084 Generalized abdominal pain: Secondary | ICD-10-CM

## 2015-04-18 MED ORDER — DICYCLOMINE HCL 20 MG PO TABS: 20.0000 mg | ORAL_TABLET | Freq: Two times a day (BID) | ORAL | Status: DC

## 2015-04-18 MED ORDER — ONDANSETRON HCL 4 MG/2ML IJ SOLN
4.0000 mg | Freq: Once | INTRAMUSCULAR | Status: AC
  Administered 2015-04-18: 4 mg via INTRAVENOUS
  Filled 2015-04-18: qty 2

## 2015-04-18 MED ORDER — IOHEXOL 300 MG/ML  SOLN
25.0000 mL | Freq: Once | INTRAMUSCULAR | Status: AC | PRN
  Administered 2015-04-18: 25 mL via ORAL

## 2015-04-18 MED ORDER — MORPHINE SULFATE (PF) 4 MG/ML IV SOLN
4.0000 mg | Freq: Once | INTRAVENOUS | Status: AC
  Administered 2015-04-18: 4 mg via INTRAVENOUS
  Filled 2015-04-18: qty 1

## 2015-04-18 MED ORDER — OXYCODONE-ACETAMINOPHEN 5-325 MG PO TABS: 1.0000 | ORAL_TABLET | ORAL | Status: DC | PRN

## 2015-04-18 MED ORDER — IOHEXOL 300 MG/ML  SOLN
100.0000 mL | Freq: Once | INTRAMUSCULAR | Status: AC | PRN
  Administered 2015-04-18: 100 mL via INTRAVENOUS

## 2015-04-18 NOTE — ED Provider Notes (Signed)
CSN: [id number]     Arrival date & time 04/17/15  2115 History   First MD Initiated Contact with Patient 04/18/15 0108     Chief Complaint  Patient presents with  . Abdominal Pain     (Consider location/radiation/quality/duration/timing/severity/associated sxs/prior Treatment) HPI  This is a 55 year old female who presents with abdominal pain. Patient reports pain that started 2 days ago. She was in [place]. She was evaluated by physicians there and received "an antibiotic and pain medication." She continues to have pain and spoke with her primary physician to recommended she be reevaluated. Patient reports diffuse tenderness 10 abdominal pain. No nausea, vomiting, or diarrhea. States that when she was seen in [place] they suspected colitis. She was given omeprazole, Levaquin, and a pain medication. Her pain has persisted. Nothing seems to make the pain better or worse with the exception of certain movements. She states "I feel bloated." She reports subjective fevers without documented fevers.  Past Medical History  Diagnosis Date  . Osteoarthritis     bilat thumbs and hips  . Insomnia     related to pain from osteoarthritis   Past Surgical History  Procedure Laterality Date  . Tonsillectomy    . Wisdom tooth extraction    . Hernia repair    . Urethral fistula repair      after childbirth  . Cervical fusion    . Breast enhancement surgery     Family History  Problem Relation Age of Onset  . Colon cancer Neg Hx    Social History  Substance Use Topics  . Smoking status: Former Smoker -- 0.50 packs/day for 5 years    Types: Cigarettes    Quit date: 09/25/1984  . Smokeless tobacco: Never Used  . Alcohol Use: Yes     Comment: socially   OB History    No data available     Review of Systems  Constitutional: Positive for chills. Negative for fever.  Respiratory: Negative for shortness of breath.   Cardiovascular: Negative for chest pain.  Gastrointestinal: Positive for  abdominal pain and abdominal distention. Negative for nausea, vomiting, diarrhea and constipation.  All other systems reviewed and are negative.     Allergies  Review of patient's allergies indicates no known allergies.  Home Medications   Prior to Admission medications   Medication Sig Start Date End Date Taking? Authorizing Provider  levofloxacin (LEVAQUIN) 500 MG tablet Take 500 mg by mouth daily.   Yes Historical Provider, MD  omeprazole (PRILOSEC) 20 MG capsule Take 20 mg by mouth daily.   Yes Historical Provider, MD  ALPRAZolam Duanne Moron) 1 MG tablet Take 1 mg by mouth at bedtime as needed for anxiety.    Historical Provider, MD  amphetamine-dextroamphetamine (ADDERALL) 30 MG tablet Take 30 mg by mouth daily.    Historical Provider, MD  dicyclomine (BENTYL) 20 MG tablet Take 1 tablet (20 mg total) by mouth 2 (two) times daily. 04/18/15   Merryl Hacker, MD  gabapentin (NEURONTIN) 600 MG tablet Take 600 mg by mouth at bedtime.    Historical Provider, MD  HYDROcodone-acetaminophen (NORCO/VICODIN) 5-325 MG per tablet Take 1 tablet by mouth daily as needed for moderate pain.    Historical Provider, MD  meloxicam (MOBIC) 15 MG tablet Take 15 mg by mouth daily.    Historical Provider, MD  oxyCODONE-acetaminophen (PERCOCET/ROXICET) 5-325 MG tablet Take 1-2 tablets by mouth every 4 (four) hours as needed for severe pain. 04/18/15   Merryl Hacker, MD   BP  118/74 mmHg  Pulse 89  Temp(Src) 98.4 F (36.9 C) (Oral)  Resp 18  Ht 5\' 5"  (1.651 m)  Wt 130 lb (58.968 kg)  BMI 21.63 kg/m2  SpO2 95%  LMP 03/23/2015 Physical Exam  Constitutional: She is oriented to person, place, and time. She appears well-developed and well-nourished. No distress.  HENT:  Head: Normocephalic and atraumatic.  Cardiovascular: Normal rate, regular rhythm and normal heart sounds.   No murmur heard. Pulmonary/Chest: Effort normal and breath sounds normal. No respiratory distress. She has no wheezes.   Abdominal: Soft. Bowel sounds are normal. There is tenderness. There is no rebound and no guarding.  Mild diffuse tenderness to palpation without rebound or guarding  Neurological: She is alert and oriented to person, place, and time.  Skin: Skin is warm and dry.  Psychiatric: She has a normal mood and affect.  Nursing note and vitals reviewed.   ED Course  Procedures (including critical care time) Labs Review Labs Reviewed  COMPREHENSIVE METABOLIC PANEL - Abnormal; Notable for the following:    Glucose, Bld 103 (*)    Calcium 8.5 (*)    All other components within normal limits  URINALYSIS, ROUTINE W REFLEX MICROSCOPIC (NOT AT [facility]) - Abnormal; Notable for the following:    APPearance CLOUDY (*)    All other components within normal limits  LIPASE, BLOOD  CBC  PREGNANCY, URINE    Imaging Review Ct Abdomen Pelvis W Contrast  04/18/2015  CLINICAL DATA:  Abdominal pain, upper, with fever. EXAM: CT ABDOMEN AND PELVIS WITH CONTRAST TECHNIQUE: Multidetector CT imaging of the abdomen and pelvis was performed using the standard protocol following bolus administration of intravenous contrast. CONTRAST:  102mL OMNIPAQUE IOHEXOL 300 MG/ML SOLN, 11mL OMNIPAQUE IOHEXOL 300 MG/ML SOLN COMPARISON:  None. FINDINGS: Lower chest and abdominal wall:  Partly visible breast implants. Hepatobiliary: No focal liver abnormality.No evidence of biliary obstruction or stone. Pancreas: Unremarkable. Spleen: Unremarkable. Adrenals/Urinary Tract: Negative adrenals. No hydronephrosis or stone. Unremarkable bladder. Reproductive:Posterior uterine body intramural fibroid measuring 27 mm. Stomach/Bowel:  No obstruction. No appendicitis. Vascular/Lymphatic: No acute vascular abnormality. Borderline enlargement of mesenteric lymph nodes, usually incidental when seen in isolation. Peritoneal: No ascites or pneumoperitoneum. Musculoskeletal: No acute abnormalities. Lumbar disc degeneration at T10-11, L4-5 and L5-S1.  IMPRESSION: 1. No acute finding. 2. Intramural fibroid. Electronically Signed   By: Monte Fantasia M.D.   On: 04/18/2015 03:54   I have personally reviewed and evaluated these images and lab results as part of my medical decision-making.   EKG Interpretation None      MDM   Final diagnoses:  Generalized abdominal pain    Patient presents with abdominal pain. No other significant symptoms. Doubt colitis given absence of diarrhea. She is afebrile and nontoxic appearing. Mild tenderness to palpation without signs of peritonitis. Basic labwork obtained and largely reassuring. CT scan obtained and shows no evidence of intra-abdominal abnormality. Patient states that she had a brother who had a rapidly progressive cancer of unknown origin and he died at age 45. She had a screening colonoscopy at age 33 that was normal. Discuss results with the patient. Encouraged close primary and gastroenterology follow-up if symptoms persist. She'll be discharged with a short course of pain medication and Bentyl.  Continue omeprazole.  After history, exam, and medical workup I feel the patient has been appropriately medically screened and is safe for discharge home. Pertinent diagnoses were discussed with the patient. Patient was given return precautions.     Merryl Hacker,  MD 04/18/15 [phone]

## 2015-04-18 NOTE — ED Notes (Signed)
Given diet ginger ale. Tolerating. Denies pain at this time, "except when I press on my abd".

## 2015-04-18 NOTE — ED Notes (Signed)
To CT, no changes.

## 2015-04-18 NOTE — Discharge Instructions (Signed)

## 2015-04-18 NOTE — ED Notes (Signed)
Pt in [place] on Friday when abd pain started, also reports fever. Fevers have resolved. (Denies: nvd or bleeding), "feel bloated". Was seen by by [place] MD in hotel in [place], given IV butilscopalomine, ranitidine and ceftriaxone, and Rx for: levoflaxacin, omeprazole and  butilscoplamine and matimazol, last ate at 1800, here d/t "pain worse", no relief with norco PTA. Alert, NAD, calm, interactive.

## 2015-04-18 NOTE — ED Notes (Signed)
Dr. Dina Rich in to see pt, at [facility].

## 2015-04-18 NOTE — ED Notes (Signed)
Dr. Horton at [facility].  

## 2015-05-05 ENCOUNTER — Other Ambulatory Visit: Payer: Self-pay | Admitting: Obstetrics and Gynecology

## 2015-05-06 LAB — CYTOLOGY - PAP

## 2015-06-01 DIAGNOSIS — M6530 Trigger finger, unspecified finger: Secondary | ICD-10-CM | POA: Insufficient documentation

## 2015-06-01 DIAGNOSIS — M19041 Primary osteoarthritis, right hand: Secondary | ICD-10-CM | POA: Insufficient documentation

## 2015-06-01 DIAGNOSIS — M19042 Primary osteoarthritis, left hand: Secondary | ICD-10-CM | POA: Insufficient documentation

## 2015-07-07 DIAGNOSIS — M19049 Primary osteoarthritis, unspecified hand: Secondary | ICD-10-CM | POA: Insufficient documentation

## 2015-07-30 ENCOUNTER — Other Ambulatory Visit: Payer: Self-pay | Admitting: Family Medicine

## 2015-07-30 DIAGNOSIS — IMO0002 Reserved for concepts with insufficient information to code with codable children: Secondary | ICD-10-CM

## 2015-07-30 DIAGNOSIS — R229 Localized swelling, mass and lump, unspecified: Principal | ICD-10-CM

## 2015-08-12 ENCOUNTER — Other Ambulatory Visit: Payer: BLUE CROSS/BLUE SHIELD

## 2015-08-19 ENCOUNTER — Ambulatory Visit
Admission: RE | Admit: 2015-08-19 | Discharge: 2015-08-19 | Disposition: A | Discharge status: Home / Self Care | Payer: BLUE CROSS/BLUE SHIELD | Source: Ambulatory Visit | Attending: Family Medicine | Admitting: Family Medicine

## 2015-08-19 DIAGNOSIS — R229 Localized swelling, mass and lump, unspecified: Principal | ICD-10-CM

## 2015-08-19 DIAGNOSIS — IMO0002 Reserved for concepts with insufficient information to code with codable children: Secondary | ICD-10-CM

## 2015-08-23 ENCOUNTER — Other Ambulatory Visit: Payer: Self-pay | Admitting: Family Medicine

## 2015-08-23 DIAGNOSIS — R222 Localized swelling, mass and lump, trunk: Secondary | ICD-10-CM

## 2015-08-26 ENCOUNTER — Other Ambulatory Visit: Payer: BLUE CROSS/BLUE SHIELD

## 2015-09-01 ENCOUNTER — Ambulatory Visit
Admission: RE | Admit: 2015-09-01 | Discharge: 2015-09-01 | Disposition: A | Discharge status: Home / Self Care | Payer: BLUE CROSS/BLUE SHIELD | Source: Ambulatory Visit | Attending: Family Medicine | Admitting: Family Medicine

## 2015-09-01 DIAGNOSIS — R222 Localized swelling, mass and lump, trunk: Secondary | ICD-10-CM

## 2015-11-11 DIAGNOSIS — M461 Sacroiliitis, not elsewhere classified: Secondary | ICD-10-CM | POA: Insufficient documentation

## 2015-12-01 ENCOUNTER — Ambulatory Visit
Admission: RE | Admit: 2015-12-01 | Discharge: 2015-12-01 | Disposition: A | Discharge status: Home / Self Care | Payer: BLUE CROSS/BLUE SHIELD | Source: Ambulatory Visit | Attending: Pain Medicine | Admitting: Pain Medicine

## 2015-12-01 ENCOUNTER — Other Ambulatory Visit: Payer: Self-pay | Admitting: Pain Medicine

## 2015-12-01 DIAGNOSIS — G8929 Other chronic pain: Secondary | ICD-10-CM

## 2015-12-01 DIAGNOSIS — M150 Primary generalized (osteo)arthritis: Secondary | ICD-10-CM

## 2015-12-01 DIAGNOSIS — M533 Sacrococcygeal disorders, not elsewhere classified: Principal | ICD-10-CM

## 2016-11-13 ENCOUNTER — Other Ambulatory Visit: Payer: Self-pay | Admitting: General Surgery

## 2016-11-21 ENCOUNTER — Ambulatory Visit (INDEPENDENT_AMBULATORY_CARE_PROVIDER_SITE_OTHER): Payer: BLUE CROSS/BLUE SHIELD | Admitting: Sports Medicine

## 2016-11-21 ENCOUNTER — Ambulatory Visit (INDEPENDENT_AMBULATORY_CARE_PROVIDER_SITE_OTHER): Payer: BLUE CROSS/BLUE SHIELD

## 2016-11-21 ENCOUNTER — Ambulatory Visit: Payer: Self-pay

## 2016-11-21 ENCOUNTER — Encounter: Payer: Self-pay | Admitting: Sports Medicine

## 2016-11-21 VITALS — BP 130/92 | HR 82 | Ht 65.0 in | Wt 134.6 lb

## 2016-11-21 DIAGNOSIS — M2550 Pain in unspecified joint: Secondary | ICD-10-CM | POA: Diagnosis not present

## 2016-11-21 DIAGNOSIS — M79601 Pain in right arm: Secondary | ICD-10-CM

## 2016-11-21 DIAGNOSIS — M19019 Primary osteoarthritis, unspecified shoulder: Secondary | ICD-10-CM | POA: Diagnosis not present

## 2016-11-21 DIAGNOSIS — M19041 Primary osteoarthritis, right hand: Secondary | ICD-10-CM

## 2016-11-21 DIAGNOSIS — M19049 Primary osteoarthritis, unspecified hand: Secondary | ICD-10-CM | POA: Diagnosis not present

## 2016-11-21 LAB — COMPREHENSIVE METABOLIC PANEL
ALBUMIN: 4.7 g/dL (ref 3.5–5.2)
ALK PHOS: 65 U/L (ref 39–117)
ALT: 59 U/L — ABNORMAL HIGH (ref 0–35)
AST: 113 U/L — AB (ref 0–37)
BUN: 11 mg/dL (ref 6–23)
CHLORIDE: 101 meq/L (ref 96–112)
CO2: 28 mEq/L (ref 19–32)
CREATININE: 0.75 mg/dL (ref 0.40–1.20)
Calcium: 10.3 mg/dL (ref 8.4–10.5)
GFR: 85.42 mL/min (ref >60.00)
GLUCOSE: 92 mg/dL (ref 70–99)
Potassium: 4.8 mEq/L (ref 3.5–5.1)
SODIUM: 140 meq/L (ref 135–145)
TOTAL PROTEIN: 7.8 g/dL (ref 6.0–8.3)
Total Bilirubin: 0.5 mg/dL (ref 0.2–1.2)

## 2016-11-21 LAB — SEDIMENTATION RATE: SED RATE: 3 mm/h (ref 0–30)

## 2016-11-21 LAB — C-REACTIVE PROTEIN: CRP: 0.1 mg/dL — ABNORMAL LOW (ref 0.5–20.0)

## 2016-11-21 NOTE — Procedures (Signed)
PROCEDURE NOTE - ULTRASOUND GUIDED INJECTION: Right shoulder Images were obtained and interpreted by myself, Teresa Coombs, DO  Images have been saved and stored to PACS system. Images obtained on: GE S7 Ultrasound machine  ULTRASOUND FINDINGS:  Hypoechoic change from the biceps tendon which appears to be intact.  She has a small intra-articular effusion.  Small amount of subacromial bursitis.  Supraspinatus and infraspinatus tendons are intact.  She has a moderate degree of spurring and a possible Hill-Sachs lesion versus erosion on the posterior aspect of the humerus.  Moderate degree of swelling around the posterior labrum with apparent tear.  DESCRIPTION OF PROCEDURE:  The patient's clinical condition is marked by substantial pain and/or significant functional disability. Other conservative therapy has not provided relief, is contraindicated, or not appropriate. There is a reasonable likelihood that injection will significantly improve the patient's pain and/or functional impairment. After discussing the risks, benefits and expected outcomes of the injection and all questions were reviewed and answered, the patient wished to undergo the above named procedure. Verbal consent was obtained. The ultrasound was used to identify the target structure and adjacent neurovascular structures. The skin was then prepped in sterile fashion and the target structure was injected under direct visualization using sterile technique as below: PREP: Alcohol, Ethel Chloride APPROACH: Posterior, single injection, 21g 2" needle INJECTATE: 2 cc 0.5% marcaine, 2 cc 40mg  DepoMedrol ASPIRATE: N/A DRESSING: Band-Aid  Post procedural instructions including recommending icing and warning signs for infection were reviewed. This procedure was well tolerated and there were no complications.   IMPRESSION: Succesful US Guided Injection

## 2016-11-21 NOTE — Progress Notes (Signed)
OFFICE VISIT NOTE Beverly Dougherty. Charmin Aguiniga, Beverly Dougherty - 56 y.o. female MRN [id number]  Date of birth: 1960-09-26  Visit Date: 11/21/2016  PCP: Derinda Late, MD   Referred by: Derinda Late, MD  Burlene Arnt, CMA acting as scribe for Dr. [name].  SUBJECTIVE:   Chief Complaint  Patient presents with  . New Patient (Initial Visit)    RT shoulder pain   HPI: As below and per problem based documentation when appropriate.  Beverly Dougherty is a new patient presenting today for evaluation of RT shoulder pain.  Pain has been present x 6 weeks.  She has been traveling and care giving more often then normal. She has also started golfing again recently. She is unsure if her pain is related.   The pain is described as "throbbing like a tooth ache" and is rated as 8/10.  Pain is worse after waking up in the morning. Pain does wake her from sleep at times. She also has pain while driving. She does Yoga but she has temporarily stopped d/t the pain and fear of making it worse.  Improves with staying busy. She feels that sx aren't as bad when she is trying not to think about them.  Therapies tried include : She has been doing Chiropractic with Laverle Hobby. She has been icing and using heat on the shoulder.   Other associated symptoms include: Yesterday she did notice a spasm in her LT shoulder, she feels she has been over compensating. She has arthritis in hands and hips. She has radiation of pain into the neck. She has also been having more HA than normal. She denies radiation of pain into the fingers. She has hx of spinal fusion at C5-C6 done in 1998.   No recent xray of shoulders or neck.     Review of Systems  Constitutional: Negative for chills and fever.  Respiratory: Negative for shortness of breath and wheezing.   Cardiovascular: Negative for chest pain and palpitations.  Musculoskeletal: Positive  for joint pain and neck pain.  Neurological: Positive for tingling (numbness and tingling in neck) and headaches. Negative for dizziness.    Otherwise per HPI.  HISTORY & PERTINENT PRIOR DATA:  No specialty comments available. She reports that she quit smoking about 32 years ago. Her smoking use included Cigarettes. She has a 2.50 pack-year smoking history. She has never used smokeless tobacco. No results for input(s): HGBA1C, LABURIC in the last 8760 hours. Allergies reviewed per EMR Prior to Admission medications   Medication Sig Start Date End Date Taking? Authorizing Provider  ALPRAZolam Duanne Moron) 1 MG tablet Take 1 mg by mouth at bedtime as needed for anxiety.   Yes [provider]  amphetamine-dextroamphetamine (ADDERALL) 15 MG tablet Take 30 mg by mouth daily.   Yes [provider]  HYDROcodone-acetaminophen (NORCO/VICODIN) 5-325 MG per tablet Take 1 tablet by mouth daily as needed for moderate pain.   Yes [provider]  meloxicam (MOBIC) 15 MG tablet Take 15 mg by mouth daily.   Yes [provider]   Patient Active Problem List   Diagnosis Date Noted  . Polyarthralgia 11/21/2016  . Shoulder arthritis 11/21/2016  . Sacroiliitis ([place]) 11/11/2015  . [place] arthritis 07/07/2015  . OA (osteoarthritis) of finger, right 06/01/2015  . Osteoarthritis of finger of left hand 06/01/2015  . Right trigger finger 06/01/2015   Past Medical History:  Diagnosis Date  . 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 Osteoarthritis    bilat thumbs and hips   Family History  Problem Relation Age of Onset  . Colon cancer Neg Hx    Past Surgical History:  Procedure Laterality Date  . BREAST ENHANCEMENT SURGERY    . CERVICAL FUSION    . HERNIA REPAIR    . TONSILLECTOMY    . URETHRAL FISTULA REPAIR     after childbirth  . WISDOM TOOTH EXTRACTION     Social History   Occupational History  . Not on file.   Social History Main Topics  . Smoking  status: Former Smoker    Packs/day: 0.50    Years: 5.00    Types: Cigarettes    Quit date: 09/25/1984  . Smokeless tobacco: Never Used  . Alcohol use Yes     Comment: socially  . Drug use: No  . Sexual activity: Not on file    OBJECTIVE:  VS:  HT:5\' 5"  (165.1 cm)   WT:134 lb 9.6 oz (61.1 kg)  BMI:22.4    BP:(!) 130/92  HR:82bpm  TEMP: ( )  RESP:97 % EXAM: Findings:  WDWN, NAD, Non-toxic appearing Alert & appropriately interactive Not depressed or anxious appearing No increased work of breathing. Pupils are equal. EOM intact without nystagmus No clubbing or cyanosis of the extremities appreciated No significant rashes/lesions/ulcerations overlying the examined area. Radial pulses 2+/4.  No significant generalized UE edema. Sensation intact to light touch in upper extremities.  Right Shoulder Exam: Normal alignment, Normal Contours No overlying erythema/ecchymosis. Moderate amount of crepitation with axial load and circumduction with a moderate degree of guarding. TTP over: Anterior posterior shoulder, no focal bony tenderness. Internal Rotation: normal  External Rotation: Normal Empty can: Normal Hawkins: Normal Neers: Normal Speeds:Abnormal-pain O'Brien's: Small amount of pain    Marked degenerative changes of bilateral PIP joints with significant bossing and small amount of fluctuance of the left fifth finger PIP but no surrounding erythema.    RADIOLOGY: DG Shoulder Right CLINICAL DATA:  Right shoulder pain.  Radiates to the neck.  EXAM: RIGHT SHOULDER - 2+ VIEW  COMPARISON:  None.  FINDINGS: No acute fracture or dislocation. Small bony fragment along the inferior margin of the glenoid which may reflect a bony Bankart lesions secondary to prior anterior shoulder dislocation. Glenohumeral joint space is maintained. Normal acromioclavicular joint.  IMPRESSION: 1.  No acute osseous injury of the right shoulder.  Electronically Signed   By: Kathreen Devoid   On: 11/21/2016 10:25 US GUIDED NEEDLE PLACEMENT(NO LINKED CHARGES) Gerda Diss, DO     11/21/2016 10:54 AM PROCEDURE NOTE - ULTRASOUND GUIDED INJECTION: Right shoulder Images were obtained and interpreted by myself, Teresa Coombs, DO   Images have been saved and stored to PACS system. Images obtained on: GE S7 Ultrasound machine  ULTRASOUND FINDINGS:  Hypoechoic change from the biceps tendon which appears to be  intact.  She has a small intra-articular effusion.  Small amount  of subacromial bursitis.  Supraspinatus and infraspinatus tendons  are intact.  She has a moderate degree of spurring and a possible  Hill-Sachs lesion versus erosion on the posterior aspect of the  humerus.  Moderate degree of swelling around the posterior labrum  with apparent tear.  DESCRIPTION OF PROCEDURE:  The patient's clinical condition is marked by substantial pain  and/or significant functional disability. Other conservative  therapy has not provided relief, is contraindicated, or not  appropriate. There is a reasonable likelihood that injection will  significantly improve the patient's pain and/or functional  impairment. After discussing the risks, benefits and expected  outcomes of the injection and all questions were reviewed and  answered, the patient wished to undergo the above named  procedure. Verbal consent was obtained. The ultrasound was used  to identify the target structure and adjacent neurovascular  structures. The skin was then prepped in sterile fashion and the  target structure was injected under direct visualization using  sterile technique as below: PREP: Alcohol, Ethel Chloride APPROACH: Posterior, single injection, 21g 2" needle INJECTATE: 2 cc 0.5% marcaine, 2 cc 40mg  DepoMedrol ASPIRATE: N/A DRESSING: Band-Aid  Post procedural instructions including recommending icing and  warning signs for infection were reviewed. This procedure was  well tolerated and  there were no complications.   IMPRESSION: Succesful US Guided Injection DG Cervical Spine Complete CLINICAL DATA:  Right shoulder pain with radiation to right side.  EXAM: CERVICAL SPINE - COMPLETE 4+ VIEW  COMPARISON:  MRI 12/03/2013 .  MRI 11/02/2011 .  FINDINGS: Postsurgical changes C6-C7 unchanged. C6 compression unchanged. Diffuse multilevel degenerative change with loss of normal cervical lordosis. Degenerative changes most prominent from C5 through C7. 3 mm anterolisthesis C3 on C4 and C4 on C5. This is most likely degenerative.  IMPRESSION: 1. No acute abnormality. Stable postsurgical changes C6-C7. C6 compression unchanged.  2. Diffuse multilevel degenerative change with loss of normal cervical lordosis. Degenerative changes most prominent from C5 through C7. 3 mm anterolisthesis C3 on C4 and C4 on C5 most likely degenerative.  Electronically Signed   By: Marcello Moores  Register   On: 11/21/2016 10:21  ASSESSMENT & PLAN:     ICD-10-CM   1. Right arm pain M79.601 DG Cervical Spine Complete    DG Shoulder Right    US GUIDED NEEDLE PLACEMENT(NO LINKED CHARGES)  2. Polyarthralgia M25.50 Comprehensive metabolic panel    Sedimentation rate    Rheumatoid factor    C-reactive protein    Cyclic citrul peptide antibody, IgG    ANA  3. 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 OA (osteoarthritis) of finger, right M19.041    ================================================================= Shoulder arthritis No prior history of shoulder dislocation per patient report there is definitive osteoarthritic changes with a small effusion and small amount of subacromial bursitis but this is minimal.  Shoulder injection performed today given extensive synovitis that is appreciated on the posterior aspect of the shoulder.  It does appear to be a possible bony Bankart on x-rays and a possible Hill-Sachs lesion on ultrasound that was appreciated but once again no known  mechanism.  Given the significant polyarthralgia complaints and significant changes of her IP joints more notably her PIP and CMC's I am concerned for potential inflammatory arthropathy and further lab evaluation for this was repeated today.  We will plan to see how she responds to corticosteroid injection and if any lack of improvement can consider further diagnostic evaluation with MRI  Polyarthralgia Previously seen by [facility] and 5 years ago had normal serology however given worsening symptoms repeating this at this time is prudent.  PROCEDURE NOTE - ULTRASOUND GUIDED INJECTION: Right shoulder Images were obtained and interpreted by myself, Teresa Coombs, DO  Images have been saved and stored to PACS system. Images obtained on: GE S7 Ultrasound machine  ULTRASOUND FINDINGS:  Hypoechoic change from the biceps tendon which appears to be intact.  She has a small intra-articular effusion.  Small amount of subacromial bursitis.  Supraspinatus and infraspinatus tendons are intact.  She has a moderate degree of spurring and a possible Hill-Sachs lesion versus erosion on the posterior aspect of the humerus.  Moderate degree of swelling around the posterior labrum with apparent tear.  DESCRIPTION OF PROCEDURE:  The patient's clinical condition is marked by substantial pain and/or significant functional disability. Other conservative therapy has not provided relief, is contraindicated, or not appropriate. There is a reasonable likelihood that injection will significantly improve the patient's pain and/or functional impairment. After discussing the risks, benefits and expected outcomes of the injection and all questions were reviewed and answered, the patient wished to undergo the above named procedure. Verbal consent was obtained. The ultrasound was used to identify the target structure and adjacent neurovascular structures. The skin was then prepped in sterile fashion and the target structure was  injected under direct visualization using sterile technique as below: PREP: Alcohol, Ethel Chloride APPROACH: Posterior, single injection, 21g 2" needle INJECTATE: 2 cc 0.5% marcaine, 2 cc 40mg  DepoMedrol ASPIRATE: N/A DRESSING: Band-Aid  Post procedural instructions including recommending icing and warning signs for infection were reviewed. This procedure was well tolerated and there were no complications.   IMPRESSION: Succesful US Guided Injection   ================================================================= Patient Instructions  You had an injection today.  Things to be aware of after injection are listed below: . You may experience no significant improvement or even a slight worsening in your symptoms during the first 24 to 48 hours.  After that we expect your symptoms to improve gradually over the next 2 weeks for the medicine to have its maximal effect.  You should continue to have improvement out to 6 weeks after your injection. . Dr. [name] recommends icing the site of the injection for 20 minutes  1-2 times the day of your injection . You may shower but no swimming, tub bath or Jacuzzi for 24 hours. . If your bandage falls off this does not need to be replaced.  It is appropriate to remove the bandage after 4 hours. . You may resume light activities as tolerated unless otherwise directed per Dr. [name] during your visit  POSSIBLE STEROID SIDE EFFECTS:  Side effects from injectable steroids tend to be less than when taken orally however you may experience some of the symptoms listed below.  If experienced these should only last for a short period of time. Change in menstrual flow  Edema (swelling)  Increased appetite Skin flushing (redness)  Skin rash/acne  Thrush (oral) Yeast vaginitis    Increased sweating  Depression Increased blood glucose levels Cramping and leg/calf  Euphoria (feeling happy)  POSSIBLE PROCEDURE SIDE EFFECTS: The side effects of the injection are  usually fairly minimal however if you may experience some of the following side effects that are usually self-limited and will is off on their own.  If you are concerned please feel free to call the office with questions:  Increased numbness or tingling  Nausea or vomiting  Swelling or bruising at the injection site   Please call our office if if you experience any of the following symptoms over the next week as these can be signs of infection:   Fever greater than 100.50F  Significant swelling at the injection site  Significant redness or drainage from the injection site  If after 2 weeks you are continuing to have worsening symptoms please call our office to discuss what the next appropriate actions should be including the potential for a return office visit or other diagnostic testing.    =================================================================  Follow-up: Return  in about 6 weeks (around 01/02/2017).   CMA/ATC served as Education administrator during this visit. History, Physical, and Plan performed by medical provider. Documentation and orders reviewed and attested to.      Teresa Coombs, [facility] Physician

## 2016-11-21 NOTE — Patient Instructions (Signed)

## 2016-11-21 NOTE — Assessment & Plan Note (Signed)
Previously seen by [facility] and 5 years ago had normal serology however given worsening symptoms repeating this at this time is prudent.

## 2016-11-21 NOTE — Assessment & Plan Note (Signed)
No prior history of shoulder dislocation per patient report there is definitive osteoarthritic changes with a small effusion and small amount of subacromial bursitis but this is minimal.  Shoulder injection performed today given extensive synovitis that is appreciated on the posterior aspect of the shoulder.  It does appear to be a possible bony Bankart on x-rays and a possible Hill-Sachs lesion on ultrasound that was appreciated but once again no known mechanism.  Given the significant polyarthralgia complaints and significant changes of her IP joints more notably her PIP and CMC's I am concerned for potential inflammatory arthropathy and further lab evaluation for this was repeated today.  We will plan to see how she responds to corticosteroid injection and if any lack of improvement can consider further diagnostic evaluation with MRI

## 2016-11-22 LAB — ANA: Anti Nuclear Antibody(ANA): NEGATIVE

## 2016-11-22 LAB — CYCLIC CITRUL PEPTIDE ANTIBODY, IGG: Cyclic Citrullin Peptide Ab: <16 UNITS

## 2016-11-22 LAB — RHEUMATOID FACTOR: RHEUMATOID FACTOR: 34 [IU]/mL — AB (ref <14)

## 2016-11-24 ENCOUNTER — Other Ambulatory Visit: Payer: Self-pay

## 2016-11-24 DIAGNOSIS — R768 Other specified abnormal immunological findings in serum: Secondary | ICD-10-CM

## 2017-01-04 ENCOUNTER — Ambulatory Visit: Payer: BLUE CROSS/BLUE SHIELD | Admitting: Sports Medicine

## 2017-06-21 ENCOUNTER — Encounter: Payer: Self-pay | Admitting: Sports Medicine

## 2017-06-21 ENCOUNTER — Ambulatory Visit: Payer: Self-pay

## 2017-06-21 ENCOUNTER — Ambulatory Visit: Payer: BLUE CROSS/BLUE SHIELD | Admitting: Sports Medicine

## 2017-06-21 VITALS — BP 160/100 | HR 80 | Ht 65.0 in | Wt 134.0 lb

## 2017-06-21 DIAGNOSIS — M19019 Primary osteoarthritis, unspecified shoulder: Secondary | ICD-10-CM

## 2017-06-21 NOTE — Progress Notes (Signed)
Beverly Dougherty. Beverly Dougherty, Beverly Dougherty at Beverly Dougherty - 57 y.o. female MRN [id number]  Date of birth: 04-Nov-1960  Visit Date: 06/21/2017  PCP: Beverly Late, MD   Referred by: Beverly Late, MD  Scribe for today's visit: Beverly Dougherty, CMA     SUBJECTIVE:  Beverly Dougherty is here for Follow-up (R shoulder pain)  11/21/2016: Beverly Dougherty is a new patient presenting today for evaluation of RT shoulder pain.  Pain has been present x 6 weeks.  She has been traveling and care giving more often then normal. She has also started golfing again recently. She is unsure if her pain is related.  The pain is described as "throbbing like a tooth ache" and is rated as 8/10. Pain is worse after waking up in the morning. Pain does wake her from sleep at times. She also has pain while driving. She does Yoga but she has temporarily stopped d/t the pain and fear of making it worse.  Improves with staying busy. She feels that sx aren't as bad when she is trying not to think about them.  Therapies tried include : She has been doing Chiropractic with Beverly Dougherty. She has been icing and using heat on the shoulder.  Other associated symptoms include: Yesterday she did notice a spasm in her LT shoulder, she feels she has been over compensating. She has arthritis in hands and hips. She has radiation of pain into the neck. She has also been having more HA than normal. She denies radiation of pain into the fingers. She has hx of spinal fusion at C5-C6 done in 1998.  No recent xray of shoulders or neck.   06/21/2017 Compared to the last office visit, her previously described symptoms are worsening, sx are more chronic. Pain is worse at night, she tends to sleep on her shoulder. Her ROM has decreased slightly.  Current symptoms are moderate (can be severe 8/10 at times) & are radiating to the deltoid.  She has been using heat and ice with some  relief. She has tried topical CBD oil with some relief. She has also taken Meloxicam and IBU.   XR c-spine and R shoulder 11/21/2017  She received intraarticular steroid injection 1016/2019. She responded well to the injection but pain started to return about 1.5 months.   Recently she has noticed increased r-sided neck pain. Pain started about 3 weeks ago and feels like a "zig" and tenderness. At times it will cause her to see stars.   ROS Reports night time disturbances. Denies fevers, chills, or night sweats. Denies unexplained weight loss. Denies changes in bowel or bladder habits. Denies recent unreported falls. Denies new or worsening dyspnea or wheezing. Denies headaches or dizziness.  Reports numbness, tingling or weakness  In the extremities.  Denies dizziness or presyncopal episodes Denies lower extremity edema    HISTORY & PERTINENT PRIOR DATA:  Prior History reviewed and updated per electronic medical record.  Significant/pertinent history, findings, studies include:  reports that she quit smoking about 32 years ago. Her smoking use included cigarettes. She has a 2.50 pack-year smoking history. She has never used smokeless tobacco. No results for input(s): HGBA1C, LABURIC, CREATINE in the last 8760 hours. No specialty comments available. No problems updated.  OBJECTIVE:  VS:  HT:5\' 5"  (165.1 cm)   WT:134 lb (60.8 kg)  BMI:22.3    BP:(Abnormal) 160/100  HR:80bpm  TEMP: ( )  RESP:98 %  PHYSICAL EXAM: Constitutional: WDWN, Non-toxic appearing. Psychiatric: Alert & appropriately interactive.  Not depressed or anxious appearing. Respiratory: No increased work of breathing.  Trachea Midline Eyes: Pupils are equal.  EOM intact without nystagmus.  No scleral icterus  Vascular Exam: warm to touch no edema  upper extremity neuro exam: unremarkable  MSK Exam: Negative Spurling's compression test.  She has pain with Hawkins and Neer's but more pain with axial load  and circumduction of the shoulder.  Markedly limited shoulder arc and overhead range of motion.  Intrinsic rotator cuff strength testing is intact.   ASSESSMENT & PLAN:   1. Shoulder arthritis     PLAN: Intra-articular injection performed today.  Follow-up for any worsening symptoms.  May be a candidate in the future for repeat injections versus further diagnostic evaluation with MRI however at this time given the findings on ultrasound and overall good functional status will defer further management.  Prior x-rays from October 2018 reviewed.  Follow-up: Return if symptoms worsen or fail to improve.     Please see additional documentation for Objective, Assessment and Plan sections. Pertinent additional documentation may be included in corresponding procedure notes, imaging studies, problem based documentation and patient instructions. Please see these sections of the encounter for additional information regarding this visit.  CMA/ATC served as Education administrator during this visit. History, Physical, and Plan performed by medical provider. Documentation and orders reviewed and attested to.      Beverly Dougherty, [facility] Physician

## 2017-06-21 NOTE — Progress Notes (Signed)
PROCEDURE NOTE:  Ultrasound Guided: Injection: Right shoulder Images were obtained and interpreted by myself, Teresa Coombs, DO  Images have been saved and stored to PACS system. Images obtained on: GE S7 Ultrasound machine   DESCRIPTION OF PROCEDURE:  The patient's clinical condition is marked by substantial pain and/or significant functional disability. Other conservative therapy has not provided relief, is contraindicated, or not appropriate. There is a reasonable likelihood that injection will significantly improve the patient's pain and/or functional impairment.   After discussing the risks, benefits and expected outcomes of the injection and all questions were reviewed and answered, the patient wished to undergo the above named procedure.  Verbal consent was obtained.  The ultrasound was used to identify the target structure and adjacent neurovascular structures. The skin was then prepped in sterile fashion and the target structure was injected under direct visualization using sterile technique as below:  PREP: Alcohol and Ethel Chloride APPROACH: posterior, single injection, 22g 1.5 in. INJECTATE: 2 cc 0.5% Marcaine and 2 cc 40mg /mL DepoMedrol ASPIRATE: None DRESSING: Band-Aid  Post procedural instructions including recommending icing and warning signs for infection were reviewed.    This procedure was well tolerated and there were no complications.   IMPRESSION: Succesful Ultrasound Guided: Injection

## 2017-06-21 NOTE — Patient Instructions (Signed)

## 2017-12-18 ENCOUNTER — Ambulatory Visit: Payer: Self-pay | Admitting: *Deleted

## 2017-12-18 NOTE — Telephone Encounter (Addendum)
Pt calling with complaints of numbness in the left hand and wrist for the past week and a half. Pt denies having numbness going up her arm or to other areas. Pt denies any burning, tingling or weakness to left extremity. Pt states she still has full range of motion in her left arm and denies having any recent injuries to the left shoulder.Pt states she does have some pain in left shoulder and has a prescription for Meloxicam.Pt states she was seen previously by Dr. [name] for arthritis in her right shoulder and she feels like her left shoulder is feeling the same. Pt scheduled for appt with Dr. [name] on 11/13 and advised that if symptoms become worse before appt to seek care in the ED. Pt verbalized understanding.  Reason for Disposition . [1] Numbness (i.e., loss of sensation) of the face, arm / hand, or leg / foot on one side of the body AND [2] gradual onset (e.g., days to weeks) AND [3] present now  Answer Assessment - Initial Assessment Questions 1. SYMPTOM: "What is the main symptom you are concerned about?" (e.g., weakness, numbness)     Numbness in left hand and wrist, in between pointer finger and thumb 2. ONSET: "When did this start?" (minutes, hours, days; while sleeping)     About a week and a half ago  3. LAST NORMAL: "When was the last time you were normal (no symptoms)?"     Over a week ago 4. PATTERN "Does this come and go, or has it been constant since it started?"  "Is it present now?"     Is present now and has ben since a week ago 5. CARDIAC SYMPTOMS: "Have you had any of the following symptoms: chest pain, difficulty breathing, palpitations?"     no 6. NEUROLOGIC SYMPTOMS: "Have you had any of the following symptoms: headache, dizziness, vision loss, double vision, changes in speech, unsteady on your feet?"     no 7. OTHER SYMPTOMS: "Do you have any other symptoms?"     no 8. PREGNANCY: "Is there any chance you are pregnant?" "When was your last menstrual period?"     Not  assessed  Protocols used: NEUROLOGIC DEFICIT-A-AH

## 2017-12-19 ENCOUNTER — Ambulatory Visit: Payer: BLUE CROSS/BLUE SHIELD | Admitting: Sports Medicine

## 2017-12-19 ENCOUNTER — Ambulatory Visit: Payer: Self-pay

## 2017-12-19 ENCOUNTER — Encounter: Payer: Self-pay | Admitting: Sports Medicine

## 2017-12-19 VITALS — BP 122/82 | HR 103 | Ht 65.0 in | Wt 132.4 lb

## 2017-12-19 DIAGNOSIS — M25542 Pain in joints of left hand: Secondary | ICD-10-CM | POA: Diagnosis not present

## 2017-12-19 DIAGNOSIS — M19042 Primary osteoarthritis, left hand: Secondary | ICD-10-CM | POA: Diagnosis not present

## 2017-12-19 DIAGNOSIS — G5602 Carpal tunnel syndrome, left upper limb: Secondary | ICD-10-CM | POA: Diagnosis not present

## 2017-12-19 DIAGNOSIS — M059 Rheumatoid arthritis with rheumatoid factor, unspecified: Secondary | ICD-10-CM

## 2017-12-19 MED ORDER — DICLOFENAC SODIUM 2 % TD SOLN: 1.0000 "application " | Freq: Two times a day (BID) | TRANSDERMAL | 2 refills | Status: DC

## 2017-12-19 MED ORDER — DICLOFENAC SODIUM 2 % TD SOLN: 1.0000 "application " | Freq: Two times a day (BID) | TRANSDERMAL | 0 refills | Status: AC

## 2017-12-19 NOTE — Patient Instructions (Addendum)
Pennsaid instructions: You have been given a sample/prescription for Pennsaid, a topical medication.     You are to apply this gel to your injured body part twice daily (morning and evening).   A little goes a long way so you can use about a pea-sized amount for each area.   Spread this small amount over the area into a thin film and let it dry.   Be sure that you do not rub the gel into your skin for more than 10 or 15 seconds otherwise it can irritate you skin.    Once you apply the gel, please do not put any other lotion or clothing in contact with that area for 30 minutes to allow the gel to absorb into your skin.   Some people are sensitive to the medication and can develop a sunburn-like rash.  If you have only mild symptoms it is okay to continue to use the medication but if you have any breakdown of your skin you should discontinue its use and please let us know.   If you have been written a prescription for Pennsaid, you will receive a pump bottle of this topical gel through a mail order pharmacy.  The instructions on the bottle will say to apply two pumps twice a day which may be too much gel for your particular area so use the pea-sized amount as your guide.   You had an injection today.  Things to be aware of after injection are listed below: . You may experience no significant improvement or even a slight worsening in your symptoms during the first 24 to 48 hours.  After that we expect your symptoms to improve gradually over the next 2 weeks for the medicine to have its maximal effect.  You should continue to have improvement out to 6 weeks after your injection. . Dr. [name] recommends icing the site of the injection for 20 minutes  1-2 times the day of your injection . You may shower but no swimming, tub bath or Jacuzzi for 24 hours. . If your bandage falls off this does not need to be replaced.  It is appropriate to remove the bandage after 4 hours. . You may resume light  activities as tolerated unless otherwise directed per Dr. [name] during your visit  POSSIBLE STEROID SIDE EFFECTS:  Side effects from injectable steroids tend to be less than when taken orally however you may experience some of the symptoms listed below.  If experienced these should only last for a short period of time. Change in menstrual flow  Edema (swelling)  Increased appetite Skin flushing (redness)  Skin rash/acne  Thrush (oral) Yeast vaginitis    Increased sweating  Depression Increased blood glucose levels Cramping and leg/calf  Euphoria (feeling happy)  POSSIBLE PROCEDURE SIDE EFFECTS: The side effects of the injection are usually fairly minimal however if you may experience some of the following side effects that are usually self-limited and will is off on their own.  If you are concerned please feel free to call the office with questions:  Increased numbness or tingling  Nausea or vomiting  Swelling or bruising at the injection site   Please call our office if if you experience any of the following symptoms over the next week as these can be signs of infection:   Fever greater than 100.76F  Significant swelling at the injection site  Significant redness or drainage from the injection site  If after 2 weeks you are continuing to have  worsening symptoms please call our office to discuss what the next appropriate actions should be including the potential for a return office visit or other diagnostic testing.

## 2017-12-19 NOTE — Procedures (Signed)
PROCEDURE NOTE:  Ultrasound Guided: Injection: Left carpal tunnel, hydrodissection Images were obtained and interpreted by myself, Teresa Coombs, DO  Images have been saved and stored to PACS system. Images obtained on: GE S7 Ultrasound machine    ULTRASOUND FINDINGS:  Median nerve measures 0.16cm  DESCRIPTION OF PROCEDURE:  The patient's clinical condition is marked by substantial pain and/or significant functional disability. Other conservative therapy has not provided relief, is contraindicated, or not appropriate. There is a reasonable likelihood that injection will significantly improve the patient's pain and/or functional impairment.   After discussing the risks, benefits and expected outcomes of the injection and all questions were reviewed and answered, the patient wished to undergo the above named procedure.  Verbal consent was obtained.  The ultrasound was used to identify the target structure and adjacent neurovascular structures. The skin was then prepped in sterile fashion and the target structure was injected under direct visualization using sterile technique as below:  Single injection performed as below: PREP: Alcohol and Ethel Chloride APPROACH:ulnar, single injection, 25g 1.5 in. INJECTATE: 0.5 cc 1% lidocaine, 0.5 cc 0.5% Marcaine and 0.5 cc 40mg /mL DepoMedrol ASPIRATE: None DRESSING: Band-Aid  Post procedural instructions including recommending icing and warning signs for infection were reviewed.    This procedure was well tolerated and there were no complications.   IMPRESSION: Succesful Ultrasound Guided: Injection

## 2017-12-19 NOTE — Progress Notes (Signed)
Beverly Dougherty. Beverly Dougherty, [facility] [place] - 57 y.o. female MRN [id number]  Date of birth: Nov 19, 1960  Visit Date: 12/19/2017  PCP: Derinda Late, MD   Referred by: Derinda Late, MD   Scribe(s) for today's visit: Josepha Pigg, CMA  SUBJECTIVE:  Beverly Dougherty is here for numbness in L hand  HPI: 12/19/2017: Her L hand numbness symptoms INITIALLY: Began about 1.5 weeks ago and started after experiencing L shoulder pain.  Described as mild pain and numbness, radiating from the neck, to the shoulder, into the arm and hand.  Worsened with inactivity.  Improved with activity, stretching.  Additional associated symptoms include: She c/o pain in the L shoulder that radiates into the deltoid. She has been dx with RA and raynaud's. Numbness is on the posterior aspect of the hand and in the 1st three fingers. She has hx of C6-C7 fusion, done in 2002. She reports that her hands have been feeling very cold.     At this time symptoms are worsening compared to onset.  She has been stretching with some relief from shoulder pain.  REVIEW OF SYSTEMS: Reports night time disturbances. Denies fevers, chills, or night sweats. Denies unexplained weight loss. Denies personal history of cancer. Denies changes in bowel or bladder habits. Denies recent unreported falls. Reports new or worsening dyspnea or wheezing, while hiking. Reports headaches or dizziness.  Reports numbness, tingling or weakness in the extremities.  Denies dizziness or presyncopal episodes Denies lower extremity edema    HISTORY:  Prior history reviewed and updated per electronic medical record.  Social History   Occupational History  . Not on file  Tobacco Use  . Smoking status: Former Smoker    Packs/day: 0.50    Years: 5.00    Pack years: 2.50    Types: Cigarettes    Last attempt to quit: 09/25/1984    Years since quitting: 33.2  .  Smokeless tobacco: Never Used  Substance and Sexual Activity  . Alcohol use: Yes    Comment: socially  . Drug use: No  . Sexual activity: Not on file   Social History   Social History Narrative  . Not on file     DATA OBTAINED & REVIEWED:  No results for input(s): HGBA1C, LABURIC, CREATINE in the last 8760 hours. Problem  Rheumatoid Arthritis With Positive Rheumatoid Factor (Hcc)    OBJECTIVE:  VS:  HT:5\' 5"  (165.1 cm)   WT:132 lb 6.4 oz (60.1 kg)  BMI:22.03    BP:122/82  HR:(!) 103bpm  TEMP: ( )  RESP:96 %   PHYSICAL EXAM: CONSTITUTIONAL: Well-developed, Well-nourished and In no acute distress PSYCHIATRIC: Alert & appropriately interactive. and Not depressed or anxious appearing. RESPIRATORY: No increased work of breathing and Trachea Midline EYES: Pupils are equal., EOM intact without nystagmus. and No scleral icterus.  VASCULAR EXAM: Warm and well perfused NEURO: unremarkable  MSK Exam: Bilateral hand  Marked swelling and deformity of the MCPs and IP joints.  Multiple nodules. No overlying skin breakdown. TTP over First CMC joints and second.  Pain with carpal tunnel compression test.  Negative Tinel's.   RANGE OF MOTION & STRENGTH  Limited wrist range of motion in both flexion, extension, ulnar deviation and radial deviation.   SPECIALITY TESTING:  Pain with carpal tunnel compression test.  Radial nerve distribution dysesthesia as well as median nerve distribution.  Good overhead range of motion of the shoulder and elbow appear  ASSESSMENT   1. Arthralgia of left hand   2. Left carpal tunnel syndrome   3. 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  Procedures:  . US Guided Injection per  procedure note  Medications:  Meds ordered this encounter  Medications  . Diclofenac Sodium (PENNSAID) 2 % SOLN    Sig: Place 1 application onto the skin 2 (two) times daily.    Dispense:  112 g    Refill:  2    Home Phone      [phone] Mobile          [phone]   . Diclofenac Sodium (PENNSAID) 2 % SOLN    Sig: Place 1 application onto the skin 2 (two) times daily for 1 day.    Dispense:  8 g    Refill:  0   Discussion/Instructions: No problem-specific Assessment & Plan notes found for this encounter.  . She has had significant ongoing symptoms for quite some time and likely has multifactorial hand numbness, tingling and pain.  She does have fairly classic median nerve distribution but also has some findings of radial involvement given the dorsal aspect of the numbness.  Diagnostic and therapeutic median nerve hydrodissection at the carpal tunnel performed today.  If any lack of improvement can consider nerve conduction studies. . Discussed red flag symptoms that warrant earlier emergent evaluation and patient voices understanding. . Activity modifications and the importance of avoiding exacerbating activities (limiting pain to no more than a 4 / 10 during or following activity) recommended and discussed.  Follow-up:  . Return in about 6 weeks (around 01/30/2018).  . If any lack of improvement: consider further diagnostic evaluation with Nerve conduction study     CMA/ATC served as scribe during this visit. History, Physical, and Plan performed by medical provider. Documentation and orders reviewed and attested to.      Gerda Diss, [facility] Physician

## 2017-12-23 ENCOUNTER — Other Ambulatory Visit: Payer: Self-pay

## 2017-12-23 ENCOUNTER — Emergency Department (HOSPITAL_COMMUNITY): Payer: BLUE CROSS/BLUE SHIELD

## 2017-12-23 ENCOUNTER — Emergency Department (HOSPITAL_COMMUNITY)
Admission: EM | Admit: 2017-12-23 | Discharge: 2017-12-23 | Disposition: A | Discharge status: Home / Self Care | Payer: BLUE CROSS/BLUE SHIELD | Attending: Emergency Medicine | Admitting: Emergency Medicine

## 2017-12-23 ENCOUNTER — Encounter (HOSPITAL_COMMUNITY): Payer: Self-pay

## 2017-12-23 DIAGNOSIS — R0789 Other chest pain: Secondary | ICD-10-CM | POA: Diagnosis not present

## 2017-12-23 DIAGNOSIS — E871 Hypo-osmolality and hyponatremia: Secondary | ICD-10-CM | POA: Diagnosis not present

## 2017-12-23 DIAGNOSIS — Z87891 Personal history of nicotine dependence: Secondary | ICD-10-CM | POA: Diagnosis not present

## 2017-12-23 DIAGNOSIS — Z79899 Other long term (current) drug therapy: Secondary | ICD-10-CM | POA: Insufficient documentation

## 2017-12-23 DIAGNOSIS — R0602 Shortness of breath: Secondary | ICD-10-CM | POA: Diagnosis not present

## 2017-12-23 LAB — MAGNESIUM: Magnesium: 1.8 mg/dL (ref 1.7–2.4)

## 2017-12-23 LAB — I-STAT TROPONIN, ED
Troponin i, poc: 0 ng/mL (ref 0.00–0.08)
Troponin i, poc: 0 ng/mL (ref 0.00–0.08)

## 2017-12-23 LAB — BASIC METABOLIC PANEL
ANION GAP: 13 (ref 5–15)
BUN: 18 mg/dL (ref 6–20)
CO2: 21 mmol/L — AB (ref 22–32)
Calcium: 9.8 mg/dL (ref 8.9–10.3)
Chloride: 94 mmol/L — ABNORMAL LOW (ref 98–111)
Creatinine, Ser: 1.1 mg/dL — ABNORMAL HIGH (ref 0.44–1.00)
GFR calc Af Amer: >60 mL/min (ref >60)
GFR calc non Af Amer: 55 mL/min — ABNORMAL LOW (ref >60)
GLUCOSE: 108 mg/dL — AB (ref 70–99)
Potassium: 4.5 mmol/L (ref 3.5–5.1)
Sodium: 128 mmol/L — ABNORMAL LOW (ref 135–145)

## 2017-12-23 LAB — CBC
HCT: 45.6 % (ref 36.0–46.0)
HEMOGLOBIN: 15 g/dL (ref 12.0–15.0)
MCH: 31.9 pg (ref 26.0–34.0)
MCHC: 32.9 g/dL (ref 30.0–36.0)
MCV: 97 fL (ref 80.0–100.0)
Platelets: 277 10*3/uL (ref 150–400)
RBC: 4.7 MIL/uL (ref 3.87–5.11)
RDW: 11.9 % (ref 11.5–15.5)
WBC: 5.2 10*3/uL (ref 4.0–10.5)
nRBC: 0 % (ref 0.0–0.2)

## 2017-12-23 LAB — TSH: TSH: 3.406 u[IU]/mL (ref 0.350–4.500)

## 2017-12-23 MED ORDER — SODIUM CHLORIDE 0.9 % IV BOLUS
1000.0000 mL | Freq: Once | INTRAVENOUS | Status: AC
  Administered 2017-12-23: 1000 mL via INTRAVENOUS

## 2017-12-23 MED ORDER — ONDANSETRON HCL 4 MG/2ML IJ SOLN
4.0000 mg | Freq: Once | INTRAMUSCULAR | Status: AC
  Administered 2017-12-23: 4 mg via INTRAVENOUS
  Filled 2017-12-23: qty 2

## 2017-12-23 MED ORDER — AMLODIPINE BESYLATE 5 MG PO TABS: 5.0000 mg | ORAL_TABLET | Freq: Every day | ORAL | 0 refills | Status: DC

## 2017-12-23 MED ORDER — MORPHINE SULFATE (PF) 4 MG/ML IV SOLN
4.0000 mg | Freq: Once | INTRAVENOUS | Status: AC
  Administered 2017-12-23: 4 mg via INTRAVENOUS
  Filled 2017-12-23: qty 1

## 2017-12-23 MED ORDER — ASPIRIN 81 MG PO CHEW
324.0000 mg | CHEWABLE_TABLET | Freq: Once | ORAL | Status: AC
  Administered 2017-12-23: 324 mg via ORAL
  Filled 2017-12-23: qty 4

## 2017-12-23 MED ORDER — NITROGLYCERIN 0.4 MG SL SUBL
0.4000 mg | SUBLINGUAL_TABLET | SUBLINGUAL | Status: DC | PRN
  Administered 2017-12-23: 0.4 mg via SUBLINGUAL
  Filled 2017-12-23: qty 1

## 2017-12-23 MED ORDER — IOPAMIDOL (ISOVUE-370) INJECTION 76%
INTRAVENOUS | Status: AC
  Administered 2017-12-23: 50 mL
  Filled 2017-12-23: qty 50

## 2017-12-23 NOTE — ED Notes (Signed)
Patient Alert and oriented to baseline. Stable and ambulatory to baseline. Patient verbalized understanding of the discharge instructions.  Patient belongings were taken by the patient.   

## 2017-12-23 NOTE — ED Triage Notes (Addendum)
Pt reports intermittent CP for 1 week but got worse last night. Pt also having SOB, generalized fatigue for the last week. Pain radiates to left arm and left jaw

## 2017-12-23 NOTE — Discharge Instructions (Addendum)
Stop spirinolactone and dyazide.  Start norvasc.

## 2017-12-23 NOTE — ED Provider Notes (Signed)
[facility] EMERGENCY DEPARTMENT Provider Note   CSN: [id number] Arrival date & time: 12/23/17  1154     History   Chief Complaint Chief Complaint  Patient presents with  . Chest Pain  . Shortness of Breath    HPI Beverly Dougherty is a 57 y.o. female.  Pt presents to the ED today with cp and sob.  She said sx have been going on for a few days, but today it was much worse.  She has recently been placed on estrogen to control her hot flashes.  She also started a new bp medication 2 weeks ago.  The pt has had leg cramping since her new bp medication was started.  The pt does not have a hx of CAD.  She has never had anything like this in the past.     Past Medical History:  Diagnosis Date  . 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 Osteoarthritis    bilat thumbs and hips    Patient Active Problem List   Diagnosis Date Noted  . Rheumatoid arthritis with positive rheumatoid factor ([place]) 12/19/2017  . Polyarthralgia 11/21/2016  . Shoulder arthritis 11/21/2016  . Sacroiliitis ([place]) 11/11/2015  . [place] arthritis 07/07/2015  . OA (osteoarthritis) of finger, right 06/01/2015  . Osteoarthritis of finger of left hand 06/01/2015  . Right trigger finger 06/01/2015    Past Surgical History:  Procedure Laterality Date  . BREAST ENHANCEMENT SURGERY    . CERVICAL FUSION    . HERNIA REPAIR    . TONSILLECTOMY    . URETHRAL FISTULA REPAIR     after childbirth  . WISDOM TOOTH EXTRACTION       OB History   None      Home Medications    Prior to Admission medications   Medication Sig Start Date End Date Taking? Authorizing Provider  ALPRAZolam Duanne Moron) 1 MG tablet Take 1 mg by mouth at bedtime as needed for anxiety.    [provider]  amLODipine (NORVASC) 5 MG tablet Take 1 tablet (5 mg total) by mouth daily. 12/23/17   Isla Pence, MD  amphetamine-dextroamphetamine (ADDERALL) 15 MG tablet Take 30 mg by mouth daily.    [provider]  Diclofenac Sodium (PENNSAID) 2 % SOLN Place 1 application onto the skin 2 (two) times daily. 12/19/17   Gerda Diss, DO  estradiol (CLIMARA - DOSED IN MG/24 HR) 0.05 mg/24hr patch Place 0.05 mg onto the skin once a week.    [provider]  HYDROcodone-acetaminophen (NORCO/VICODIN) 5-325 MG per tablet Take 1 tablet by mouth daily as needed for moderate pain.    [provider]  hydroxychloroquine (PLAQUENIL) 200 MG tablet Take by mouth 2 (two) times daily.    [provider]  meloxicam (MOBIC) 15 MG tablet Take 15 mg by mouth daily.    [provider]  spironolactone (ALDACTONE) 100 MG tablet Take 100 mg by mouth daily.    [provider]  triamterene-hydrochlorothiazide (DYAZIDE) 37.5-25 MG capsule Take 1 capsule by mouth daily.    [provider]    Family History Family History  Problem Relation Age of Onset  . Colon cancer Neg Hx     Social History Social History   Tobacco Use  . Smoking status: Former Smoker    Packs/day: 0.50    Years: 5.00    Pack years: 2.50    Types: Cigarettes    Last attempt to quit: 09/25/1984  Years since quitting: 33.2  . Smokeless tobacco: Never Used  Substance Use Topics  . Alcohol use: Yes    Comment: socially  . Drug use: No     Allergies   Patient has no known allergies.   Review of Systems Review of Systems  Respiratory: Positive for shortness of breath.   Cardiovascular: Positive for chest pain.  All other systems reviewed and are negative.    Physical Exam Updated Vital Signs BP 120/86   Pulse 89   Temp 98.3 F (36.8 C) (Oral)   Resp 19   Ht 5\' 4"  (1.626 m)   Wt 59 kg   SpO2 100%   BMI 22.31 kg/m   Physical Exam  Constitutional: She is oriented to person, place, and time. She appears well-developed and well-nourished.  HENT:  Head: Normocephalic and atraumatic.  Eyes: Pupils are equal, round, and reactive to light. EOM are normal.  Neck:  Normal range of motion. Neck supple.  Cardiovascular: Normal rate, regular rhythm, intact distal pulses and normal pulses.  Pulmonary/Chest: Effort normal and breath sounds normal.  Abdominal: Soft. Bowel sounds are normal.  Musculoskeletal: Normal range of motion.       Right lower leg: Normal.       Left lower leg: Normal.  Neurological: She is alert and oriented to person, place, and time.  Skin: Skin is warm and dry. Capillary refill takes less than 2 seconds.  Psychiatric: She has a normal mood and affect. Her behavior is normal.  Nursing note and vitals reviewed.    ED Treatments / Results  Labs (all labs ordered are listed, but only abnormal results are displayed) Labs Reviewed  BASIC METABOLIC PANEL - Abnormal; Notable for the following components:      Result Value   Sodium 128 (*)    Chloride 94 (*)    CO2 21 (*)    Glucose, Bld 108 (*)    Creatinine, Ser 1.10 (*)    GFR calc non Af Amer 55 (*)    All other components within normal limits  CBC  TSH  MAGNESIUM  I-STAT TROPONIN, ED  I-STAT TROPONIN, ED    EKG EKG Interpretation  Date/Time:  Sunday December 23 2017 12:05:40 EST Ventricular Rate:  94 PR Interval:    QRS Duration: 84 QT Interval:  359 QTC Calculation: 449 R Axis:   71 Text Interpretation:  Sinus rhythm Borderline short PR interval Nonspecific T abnormalities, lateral leads No old tracing to compare Confirmed by Isla Pence [phone]) on 12/23/2017 12:09:13 PM   Radiology Ct Angio Chest Pe W And/or Wo Contrast  Result Date: 12/23/2017 CLINICAL DATA:  Chest pain, shortness of breath EXAM: CT ANGIOGRAPHY CHEST WITH CONTRAST TECHNIQUE: Multidetector CT imaging of the chest was performed using the standard protocol during bolus administration of intravenous contrast. Multiplanar CT image reconstructions and MIPs were obtained to evaluate the vascular anatomy. CONTRAST:  68mL ISOVUE-370 IOPAMIDOL (ISOVUE-370) INJECTION 76% COMPARISON:  CT chest  dated 09/01/2015 FINDINGS: Cardiovascular: Satisfactory opacification of the bilateral pulmonary arteries to the segmental level. No evidence of pulmonary embolism. No evidence of thoracic aortic aneurysm or dissection. The heart is normal in size. No pericardial effusion. Mediastinum/Nodes: No suspicious mediastinal lymphadenopathy. Visualized thyroid is unremarkable. Lungs/Pleura: Lungs are essentially clear. Mild mosaic attenuation/atelectasis in the bilateral lower lobes. No focal consolidation. No suspicious pulmonary nodules. No pleural effusion or pneumothorax. Upper Abdomen: Visualized upper abdomen is grossly unremarkable. Musculoskeletal: Degenerative changes of the lower thoracic spine. Bilateral breast augmentation. Review  of the MIP images confirms the above findings. IMPRESSION: No evidence of pulmonary embolism. Negative CT chest. Electronically Signed   By: Julian Hy M.D.   On: 12/23/2017 13:46    Procedures Procedures (including critical care time)  Medications Ordered in ED Medications  nitroGLYCERIN (NITROSTAT) SL tablet 0.4 mg (0.4 mg Sublingual Given 12/23/17 1218)  aspirin chewable tablet 324 mg (324 mg Oral Given 12/23/17 1217)  sodium chloride 0.9 % bolus 1,000 mL (0 mLs Intravenous Stopped 12/23/17 1400)  morphine 4 MG/ML injection 4 mg (4 mg Intravenous Given 12/23/17 1316)  ondansetron (ZOFRAN) injection 4 mg (4 mg Intravenous Given 12/23/17 1313)  iopamidol (ISOVUE-370) 76 % injection (50 mLs  Contrast Given 12/23/17 1300)     Initial Impression / Assessment and Plan / ED Course  I have reviewed the triage vital signs and the nursing notes.  Pertinent labs & imaging results that were available during my care of the patient were reviewed by me and considered in my medical decision making (see chart for details).     Pt's sx are likely due to hyponatremia.  Aldactone and dyazide will be stopped.  I will put her on norvasc for bp control.  Heart score of 2.  2  negative troponins.  She is stable for d/c and instructed to return if worse.  Final Clinical Impressions(s) / ED Diagnoses   Final diagnoses:  Hyponatremia  Atypical chest pain    ED Discharge Orders         Ordered    amLODipine (NORVASC) 5 MG tablet  Daily     12/23/17 1355           Isla Pence, MD 12/23/17 1426

## 2017-12-28 DIAGNOSIS — R51 Headache: Secondary | ICD-10-CM | POA: Diagnosis not present

## 2017-12-28 DIAGNOSIS — M069 Rheumatoid arthritis, unspecified: Secondary | ICD-10-CM | POA: Diagnosis not present

## 2017-12-28 DIAGNOSIS — E871 Hypo-osmolality and hyponatremia: Secondary | ICD-10-CM | POA: Diagnosis not present

## 2018-01-01 DIAGNOSIS — I10 Essential (primary) hypertension: Secondary | ICD-10-CM | POA: Diagnosis not present

## 2018-01-01 DIAGNOSIS — N951 Menopausal and female climacteric states: Secondary | ICD-10-CM | POA: Diagnosis not present

## 2018-01-01 DIAGNOSIS — E871 Hypo-osmolality and hyponatremia: Secondary | ICD-10-CM | POA: Diagnosis not present

## 2018-01-01 DIAGNOSIS — M069 Rheumatoid arthritis, unspecified: Secondary | ICD-10-CM | POA: Diagnosis not present

## 2018-01-24 ENCOUNTER — Ambulatory Visit: Payer: BLUE CROSS/BLUE SHIELD | Admitting: Sports Medicine

## 2018-02-01 DIAGNOSIS — I10 Essential (primary) hypertension: Secondary | ICD-10-CM | POA: Diagnosis not present

## 2018-02-01 DIAGNOSIS — T2103XA Burn of unspecified degree of upper back, initial encounter: Secondary | ICD-10-CM | POA: Diagnosis not present

## 2018-03-08 ENCOUNTER — Ambulatory Visit: Payer: BLUE CROSS/BLUE SHIELD | Admitting: Sports Medicine

## 2018-03-08 ENCOUNTER — Ambulatory Visit: Payer: Self-pay

## 2018-03-08 ENCOUNTER — Encounter: Payer: Self-pay | Admitting: Neurology

## 2018-03-08 DIAGNOSIS — G5602 Carpal tunnel syndrome, left upper limb: Secondary | ICD-10-CM

## 2018-03-08 DIAGNOSIS — M19042 Primary osteoarthritis, left hand: Secondary | ICD-10-CM

## 2018-03-08 DIAGNOSIS — M25542 Pain in joints of left hand: Secondary | ICD-10-CM

## 2018-03-08 DIAGNOSIS — M67442 Ganglion, left hand: Secondary | ICD-10-CM

## 2018-03-08 DIAGNOSIS — M059 Rheumatoid arthritis with rheumatoid factor, unspecified: Secondary | ICD-10-CM | POA: Diagnosis not present

## 2018-03-08 NOTE — Procedures (Addendum)
PROCEDURE NOTE:  Ultrasound Guided: Aspiration and Injection: Left CMC ganglion cyst Images were obtained and interpreted by myself, Teresa Coombs, DO  Images have been saved and stored to PACS system. Images obtained on: GE S7 Ultrasound machine    ULTRASOUND FINDINGS:  Large ganglion cyst coming off of the left CMC joint.  Slight starry night appearance but not classic for gouty arthropathy.  DESCRIPTION OF PROCEDURE:  The patient's clinical condition is marked by substantial pain and/or significant functional disability. Other conservative therapy has not provided relief, is contraindicated, or not appropriate. There is a reasonable likelihood that injection will significantly improve the patient's pain and/or functional impairment.   After discussing the risks, benefits and expected outcomes of the injection and all questions were reviewed and answered, the patient wished to undergo the above named procedure.  Verbal consent was obtained.  The ultrasound was used to identify the target structure and adjacent neurovascular structures. The skin was then prepped in sterile fashion and the target structure was injected under direct visualization using sterile technique as below:   PREP: Alcohol, Ethel Chloride and 1 cc of 1% lidocaine on 25-gauge needle APPROACH:direct, stopcock technique, 18g 1.5 in. INJECTATE: 0.5 cc 0.5% Marcaine and 0.5 cc 80mg /mL DepoMedrol ASPIRATE: minimal amount  and clear and gelatinous  DRESSING: Band-Aid  Post procedural instructions including recommending icing and warning signs for infection were reviewed.    This procedure was well tolerated and there were no complications.   IMPRESSION: Succesful Ultrasound Guided: Aspiration and Injection

## 2018-03-08 NOTE — Progress Notes (Signed)
Beverly Dougherty. , [name] - 58 y.o. female MRN [id number]  Date of birth: 1960/09/02  Visit Date: March 08, 2018  PCP: Derinda Late, MD   Referred by: Derinda Late, MD  SUBJECTIVE:  Chief Complaint  Patient presents with  . Left Wrist - Follow-up    Corticosteroid inj 12/19/2017. XR C-spine 11/21/16. Prescribed Pennsaid.     HPI: Patient presents with worsening left thumb symptoms.  She has been having some anterior shoulder pain that is recurred this is minimal.  Is worsened with exercise.  The carpal tunnel hydrodissection was beneficial for reducing her hand symptoms as well as shoulder and neck symptoms until the last 1 week.  It is worsened once again with performing therapeutic exercises including yoga which she has recently obtained her certification for.  REVIEW OF SYSTEMS: Per HPI   HISTORY:  Prior history reviewed and updated per electronic medical record.  Social History   Occupational History  . Not on file  Tobacco Use  . Smoking status: Former Smoker    Packs/day: 0.50    Years: 5.00    Pack years: 2.50    Types: Cigarettes    Last attempt to quit: 09/25/1984    Years since quitting: 33.4  . Smokeless tobacco: Never Used  Substance and Sexual Activity  . Alcohol use: Yes    Comment: socially  . Drug use: No  . Sexual activity: Not on file   Social History   Social History Narrative  . Not on file    Significant rheumatoid arthritis  DATA OBTAINED & REVIEWED:  Recent Labs    12/23/17 1211  CALCIUM 9.8  MG 1.8  TSH 3.406    Problem  Ganglion Cyst of Finger of Left Hand   No specialty comments available.   OBJECTIVE:  VS:  HT:    WT:   BMI:     BP:   HR: bpm  TEMP: ( )  RESP:     PHYSICAL EXAM: Bilateral hands with early swan-neck deformities and significant CMC arthrosis as well as bossing of multiple finger joints. Left hand  with large cystic formation over the dorsum of the left thumb at the MCP.Marland Kitchen  This is tender to palpation and tense.  Pain with carpal tunnel compression test.  Full overhead range of motion   ASSESSMENT  1. Arthralgia of left hand   2. Left carpal tunnel syndrome   3. Rheumatoid arthritis with positive rheumatoid factor, involving unspecified site ([place])   4. Osteoarthritis of finger of left hand   5. Ganglion cyst of finger of left hand     PROCEDURES:  US Guided Injection per procedure note      PLAN:  Pertinent additional documentation may be included in corresponding procedure notes, imaging studies, problem based documentation and patient instructions.  No problem-specific Assessment & Plan notes found for this encounter.  Symptoms are consistent with ganglion cyst.  Given the fact that she has had persistent ongoing hand symptoms as well as return of her left shoulder symptoms that were alleviated by carpal tunnel injection we will go ahead and set her up for a nerve conduction study.  She has had significant cervical issues in the past as well but prior x-rays in 2018 were reassuring.  This may represent a component of double crush syndrome.  Continue previously prescribed home exercise program.   Activity modifications and the importance of avoiding  exacerbating activities (limiting pain to no more than a 4 / 10 during or following activity) recommended and discussed.  Discussed red flag symptoms that warrant earlier emergent evaluation and patient voices understanding.   No orders of the defined types were placed in this encounter.  Lab Orders  No laboratory test(s) ordered today    Imaging Orders     [place] MSK POCT ULTRASOUND  Referral Orders     Ambulatory referral to Neurology for nerve conduction studies  Return for results review.Gerda Diss, [place] [facility] Physician

## 2018-03-12 ENCOUNTER — Other Ambulatory Visit: Payer: Self-pay | Admitting: *Deleted

## 2018-03-12 DIAGNOSIS — G5602 Carpal tunnel syndrome, left upper limb: Secondary | ICD-10-CM

## 2018-03-14 ENCOUNTER — Other Ambulatory Visit: Payer: Self-pay | Admitting: Sports Medicine

## 2018-03-26 ENCOUNTER — Ambulatory Visit (INDEPENDENT_AMBULATORY_CARE_PROVIDER_SITE_OTHER): Payer: BLUE CROSS/BLUE SHIELD | Admitting: Neurology

## 2018-03-26 DIAGNOSIS — G5602 Carpal tunnel syndrome, left upper limb: Secondary | ICD-10-CM | POA: Diagnosis not present

## 2018-03-26 NOTE — Procedures (Signed)
   [facility] Neurology  [address]  [place], [place] [address] Tel: [phone] Fax:  [phone] Test Date:  03/26/2018  Patient: Beverly Dougherty DOB: 28-Oct-1960 Physician: Narda Amber, DO  Sex: Female Height: 5\' 5"  Ref Phys: Teresa Coombs, DO  ID#: [id number] Temp: 33.0C Technician:    Patient Complaints: This is a 58 year old female with rheumatoid arthritis and osteoarthritis referred for evaluation of left wrist pain.  NCV & EMG Findings: Extensive electrodiagnostic testing of the left upper extremity shows:  1. Left median/ulnar palmar comparison studies show prolonged latency.  Left median and ulnar sensory responses are within normal limits. 2. Left median and ulnar motor responses are within normal limits. 3. There is no evidence of active or chronic motor axonal loss changes affecting any of the tested muscles.  Motor unit configuration and recruitment pattern is within normal limits.   Impression: Left median neuropathy at or distal to the wrist, consistent with a clinical diagnosis of carpal tunnel syndrome.  Overall, these findings are very mild in degree electrically.   ___________________________ Narda Amber, DO    Nerve Conduction Studies Anti Sensory Summary Table   Site NR Peak (ms) Norm Peak (ms) P-T Amp (V) Norm P-T Amp  Left Median Anti Sensory (2nd Digit)  33C  Wrist    3.4 <3.6 24.2 >15  Left Ulnar Anti Sensory (5th Digit)  33C  Wrist    3.0 <3.1 19.3 >10   Motor Summary Table   Site NR Onset (ms) Norm Onset (ms) O-P Amp (mV) Norm O-P Amp Site1 Site2 Delta-0 (ms) Dist (cm) Vel (m/s) Norm Vel (m/s)  Left Median Motor (Abd Poll Brev)  33C  Wrist    3.6 <4.0 8.9 >6 Elbow Wrist 4.8 27.0 56 >50  Elbow    8.4  7.7         Left Ulnar Motor (Abd Dig Minimi)  33C  Wrist    2.0 <3.1 13.0 >7 B Elbow Wrist 3.5 21.0 60 >50  B Elbow    5.5  12.8  A Elbow B Elbow 2.0 10.0 50 >50  A Elbow    7.5  12.3          Comparison Summary  Table   Site NR Peak (ms) Norm Peak (ms) P-T Amp (V) Site1 Site2 Delta-P (ms) Norm Delta (ms)  Left Median/Ulnar Palm Comparison (Wrist - 8cm)  33C  Median Palm    2.1 <2.2 62.7 Median Palm Ulnar Palm 0.6   Ulnar Palm    1.5 <2.2 18.5       EMG   Side Muscle Ins Act Fibs Psw Fasc Number Recrt Dur Dur. Amp Amp. Poly Poly. Comment  Left 1stDorInt Nml Nml Nml Nml Nml Nml Nml Nml Nml Nml Nml Nml N/A  Left Abd Poll Brev Nml Nml Nml Nml Nml Nml Nml Nml Nml Nml Nml Nml N/A  Left PronatorTeres Nml Nml Nml Nml Nml Nml Nml Nml Nml Nml Nml Nml N/A  Left Biceps Nml Nml Nml Nml Nml Nml Nml Nml Nml Nml Nml Nml N/A  Left Triceps Nml Nml Nml Nml Nml Nml Nml Nml Nml Nml Nml Nml N/A  Left Deltoid Nml Nml Nml Nml Nml Nml Nml Nml Nml Nml Nml Nml N/A      Waveforms:

## 2018-03-27 NOTE — Progress Notes (Signed)
My chart message sent: Please schedule a follow-up appointment in 6 weeks  This is reassuring there is no excessive compression.  We can try an additional injection or look elsewhere for further interventions but I do not think that surgery is warranted at this time.  As long as you are doing well we can hold off on any procedures.  I would like to check in with you in about 6 weeks

## 2018-05-09 ENCOUNTER — Ambulatory Visit: Payer: BLUE CROSS/BLUE SHIELD | Admitting: Sports Medicine

## 2018-06-10 ENCOUNTER — Ambulatory Visit: Payer: BLUE CROSS/BLUE SHIELD | Admitting: Sports Medicine

## 2018-08-12 DIAGNOSIS — S060X0A Concussion without loss of consciousness, initial encounter: Secondary | ICD-10-CM | POA: Diagnosis not present

## 2018-08-27 DIAGNOSIS — I10 Essential (primary) hypertension: Secondary | ICD-10-CM | POA: Diagnosis not present

## 2018-09-05 DIAGNOSIS — R5383 Other fatigue: Secondary | ICD-10-CM | POA: Diagnosis not present

## 2018-09-05 DIAGNOSIS — M2550 Pain in unspecified joint: Secondary | ICD-10-CM | POA: Diagnosis not present

## 2018-09-13 DIAGNOSIS — R740 Nonspecific elevation of levels of transaminase and lactic acid dehydrogenase [LDH]: Secondary | ICD-10-CM | POA: Diagnosis not present

## 2018-10-11 DIAGNOSIS — Z79899 Other long term (current) drug therapy: Secondary | ICD-10-CM | POA: Diagnosis not present

## 2018-10-11 DIAGNOSIS — M0609 Rheumatoid arthritis without rheumatoid factor, multiple sites: Secondary | ICD-10-CM | POA: Diagnosis not present

## 2018-10-17 DIAGNOSIS — S0501XA Injury of conjunctiva and corneal abrasion without foreign body, right eye, initial encounter: Secondary | ICD-10-CM | POA: Diagnosis not present

## 2018-10-24 DIAGNOSIS — Z20828 Contact with and (suspected) exposure to other viral communicable diseases: Secondary | ICD-10-CM | POA: Diagnosis not present

## 2018-10-30 DIAGNOSIS — R740 Nonspecific elevation of levels of transaminase and lactic acid dehydrogenase [LDH]: Secondary | ICD-10-CM | POA: Diagnosis not present

## 2018-10-30 DIAGNOSIS — Z23 Encounter for immunization: Secondary | ICD-10-CM | POA: Diagnosis not present

## 2018-11-20 DIAGNOSIS — Z20828 Contact with and (suspected) exposure to other viral communicable diseases: Secondary | ICD-10-CM | POA: Diagnosis not present

## 2018-12-30 DIAGNOSIS — Z23 Encounter for immunization: Secondary | ICD-10-CM | POA: Diagnosis not present

## 2018-12-30 DIAGNOSIS — R7401 Elevation of levels of liver transaminase levels: Secondary | ICD-10-CM | POA: Diagnosis not present

## 2019-02-14 ENCOUNTER — Ambulatory Visit: Payer: BLUE CROSS/BLUE SHIELD | Admitting: Family Medicine

## 2019-02-14 NOTE — Progress Notes (Deleted)
   I, [name], LAT, ATC, am serving as scribe for Dr. [name].  Beverly Dougherty is a 59 y.o. female who presents to [place] at [facility] today for shoulder pain.  She was last seen by Dr. [name] on 03/08/18 fro L wrist pain.    Relevant historical information: ***   ROS:  As above  Exam:  There were no vitals taken for this visit.  MSK: ***    Lab and Radiology Results No results found for this or any previous visit (from the past 72 hour(s)). No results found.     Assessment and Plan: 59 y.o. female with ***   PDMP not reviewed this encounter. No orders of the defined types were placed in this encounter.  No orders of the defined types were placed in this encounter.    Discussed warning signs or symptoms. Please see discharge instructions. Patient expresses understanding.  ***

## 2019-02-17 ENCOUNTER — Other Ambulatory Visit: Payer: Self-pay

## 2019-02-17 ENCOUNTER — Ambulatory Visit: Payer: BC Managed Care – PPO | Admitting: Family Medicine

## 2019-02-17 NOTE — Progress Notes (Deleted)
   I, [name], LAT, ATC, am serving as scribe for Dr. [name].  Beverly Dougherty is a 59 y.o. female who presents to [place] at [facility] today for shoulder pain.  She was last seen by Dr. [name] for L wrist pain on 03/08/18.   Relevant historical information: ***   ROS:  As above  Exam:  There were no vitals taken for this visit.  MSK: ***    Lab and Radiology Results No results found for this or any previous visit (from the past 72 hour(s)). No results found.     Assessment and Plan: 59 y.o. female with ***   PDMP not reviewed this encounter. No orders of the defined types were placed in this encounter.  No orders of the defined types were placed in this encounter.    Discussed warning signs or symptoms. Please see discharge instructions. Patient expresses understanding.  ***

## 2019-02-18 ENCOUNTER — Ambulatory Visit (INDEPENDENT_AMBULATORY_CARE_PROVIDER_SITE_OTHER): Payer: BC Managed Care – PPO | Admitting: Family Medicine

## 2019-02-18 ENCOUNTER — Ambulatory Visit: Payer: Self-pay

## 2019-02-18 ENCOUNTER — Encounter: Payer: Self-pay | Admitting: Family Medicine

## 2019-02-18 VITALS — BP 120/84 | Ht 64.0 in | Wt 136.8 lb

## 2019-02-18 DIAGNOSIS — M25511 Pain in right shoulder: Secondary | ICD-10-CM

## 2019-02-18 NOTE — Patient Instructions (Signed)
Thank you for coming in today. Call or go to the ER if you develop a large red swollen joint with extreme pain or oozing puss.   Work on the home exercises.  Use voltaren gel.   Keep me update Return sooner if needed.

## 2019-02-18 NOTE — Progress Notes (Signed)
I, [name], LAT, ATC, am serving as scribe for Dr. [name].  Beverly Dougherty is a 59 y.o. female who presents to [place] at [facility] today for R shoulder pain x 3 weeks.  She was last seen by Dr. [name] for L wrist pain.  She rates her R shoulder pain at a 7/10 currently that she describes as throbbing pain that will feel like a toothache at night.  She is having radiating pain into the R upper arm but denies any mechanical shoulder symptoms.  She denies any cervical pain but does have numbness in her B hands. She has a history of both OA and RA.  Aggravating factors include laying on her R side.  She has tried ice, heat, rest and Pennsaid.   Relevant historical information: History of prior bilateral shoulder rotator cuff tendinitis, significant hand osteoarthritis, and rheumatoid arthritis.  Patient had a right posterior glenohumeral injection in 2018 which worked very well to control previous shoulder pain similar to what she is experiencing today.   ROS:  As above  Exam:  BP 120/84 (BP Location: Left Arm, Patient Position: Sitting, Cuff Size: Normal)   Ht 5\' 4"  (1.626 m)   Wt 136 lb 12.8 oz (62.1 kg)   BMI 23.48 kg/m   MSK:  Right shoulder: Normal-appearing Range of motion excellent abduction external and internal rotation. Strength intact abduction external and internal rotation. Positive Hawkins and Neer's test.  Positive empty can test. Negative Yergason's and speeds test.  Contralateral left shoulder Normal. Excellent range of motion. Intact strength. Negative impingement testing. Negative biceps tendinitis testing.  Hands bilaterally show swelling to degenerative changes at PIP and DIP throughout.  No significant MCP ulnar deviation or large swelling.    Lab and Radiology Results  Limited musculoskeletal ultrasound right shoulder: Biceps tendon intact in bicipital groove with no significant hypoechoic change. Subscapularis tendon  with hyperechoic change at tendon insertion and some hypoechoic change superficial to tendon indicating chronic calcific tendinopathy and mild bursal swelling. Supraspinatus tendon normal.  No visible tears.  Increased subacromial bursa thickness present. Infraspinatus tendon normal. Minimal posterior glenohumeral joint line effusion present. Normal AC joint. Normal bony structures otherwise. Impression: Subacromial bursitis, chronic calcific rotator cuff tendinopathy, and mild glenohumeral effusion.  Procedure: Real-time Ultrasound Guided Injection of right glenohumeral injection Device: Philips Affiniti 50G Images permanently stored and available for review in the ultrasound unit. Verbal informed consent obtained.  Discussed risks and benefits of procedure. Warned about infection bleeding damage to structures skin hypopigmentation and fat atrophy among others. Patient expresses understanding and agreement Time-out conducted.   Noted no overlying erythema, induration, or other signs of local infection.   Skin prepped in a sterile fashion.   Local anesthesia: Topical Ethyl chloride.   With sterile technique and under real time ultrasound guidance:  40 mg of Depo-Medrol and 2 mL of Marcaine injected easily.   Completed without difficulty   Pain partially resolved suggesting accurate placement of the medication.   Advised to call if fevers/chills, erythema, induration, drainage, or persistent bleeding.   Images permanently stored and available for review in the ultrasound unit.  Impression: Technically successful ultrasound guided injection.       Assessment and Plan: 59 y.o. female with right shoulder pain.  Pain is multifactorial.  Dominant finding seems to be rotator cuff tendinopathy/bursitis.  Patient also has a mild effusion and history of rheumatoid arthritis certain that could be a portion of her pain as well.  We proceeded with glenohumeral injection she had great benefit with  that with Dr. [name] in 2018.  She did not experience significant immediate pain relief right after the injection indicating her main pain generator is likely subacromial bursa/rotator cuff more than actual glenohumeral joint cause.  Plan with injection as above as well as home exercise program and recheck if not improving.   PDMP not reviewed this encounter. Orders Placed This Encounter  Procedures  . NO CHG - [place] UPPER RIGHT    Order Specific Question:   Reason for Exam (SYMPTOM  OR DIAGNOSIS REQUIRED)    Answer:   R shoulder pain    Order Specific Question:   Preferred imaging location?    Answer:   [facility]   No orders of the defined types were placed in this encounter.    Discussed warning signs or symptoms. Please see discharge instructions. Patient expresses understanding.  The above documentation has been reviewed and is accurate and complete [name]

## 2019-03-10 DIAGNOSIS — Z20828 Contact with and (suspected) exposure to other viral communicable diseases: Secondary | ICD-10-CM | POA: Diagnosis not present

## 2019-06-06 DIAGNOSIS — H5213 Myopia, bilateral: Secondary | ICD-10-CM | POA: Diagnosis not present

## 2019-06-20 DIAGNOSIS — Z6822 Body mass index (BMI) 22.0-22.9, adult: Secondary | ICD-10-CM | POA: Diagnosis not present

## 2019-06-20 DIAGNOSIS — M150 Primary generalized (osteo)arthritis: Secondary | ICD-10-CM | POA: Diagnosis not present

## 2019-06-20 DIAGNOSIS — M2550 Pain in unspecified joint: Secondary | ICD-10-CM | POA: Diagnosis not present

## 2019-07-23 DIAGNOSIS — Z23 Encounter for immunization: Secondary | ICD-10-CM | POA: Diagnosis not present

## 2019-07-23 DIAGNOSIS — Z0000 Encounter for general adult medical examination without abnormal findings: Secondary | ICD-10-CM | POA: Diagnosis not present

## 2019-07-30 DIAGNOSIS — E7800 Pure hypercholesterolemia, unspecified: Secondary | ICD-10-CM | POA: Diagnosis not present

## 2019-07-30 DIAGNOSIS — R7401 Elevation of levels of liver transaminase levels: Secondary | ICD-10-CM | POA: Diagnosis not present

## 2019-07-30 DIAGNOSIS — U071 COVID-19: Secondary | ICD-10-CM | POA: Diagnosis not present

## 2019-07-30 DIAGNOSIS — M069 Rheumatoid arthritis, unspecified: Secondary | ICD-10-CM | POA: Diagnosis not present

## 2019-07-30 DIAGNOSIS — Z0000 Encounter for general adult medical examination without abnormal findings: Secondary | ICD-10-CM | POA: Diagnosis not present

## 2019-08-07 ENCOUNTER — Ambulatory Visit: Payer: BC Managed Care – PPO | Admitting: Family Medicine

## 2019-08-07 ENCOUNTER — Other Ambulatory Visit: Payer: Self-pay

## 2019-08-07 ENCOUNTER — Encounter: Payer: Self-pay | Admitting: Family Medicine

## 2019-08-07 ENCOUNTER — Ambulatory Visit: Payer: Self-pay

## 2019-08-07 VITALS — BP 122/84 | HR 91 | Ht 64.0 in | Wt 136.0 lb

## 2019-08-07 DIAGNOSIS — M25562 Pain in left knee: Secondary | ICD-10-CM

## 2019-08-07 NOTE — Patient Instructions (Signed)
Thank you for coming in today. Call or go to the ER if you develop a large red swollen joint with extreme pain or oozing puss.  Try voltaren gel.  Use hinged knee brace or body helix knee sleeve.  Recheck as needed.    Meniscus Tear  A meniscus tear is a knee injury that happens when a piece of the meniscus is torn. The meniscus is a thick, rubbery, wedge-shaped cartilage in the knee. Two menisci are located in each knee. They sit between the upper bone (femur) and lower bone (tibia) that make up the knee joint. Each meniscus acts as a shock absorber for the knee. A torn meniscus is one of the most common types of knee injuries. This injury can range from mild to severe. Surgery may be needed to repair a severe tear. What are the causes? This condition may be caused by any kneeling, squatting, twisting, or pivoting movement. Sports-related injuries are the most common cause. These often occur from:  Running and stopping suddenly. ? Changing direction. ? Being tackled or knocked off your feet.  Lifting or carrying heavy weights. As people get older, their menisci get thinner and weaker. In these people, tears can happen more easily, such as from climbing stairs. What increases the risk? You are more likely to develop this condition if you:  Play contact sports.  Have a job that requires kneeling or squatting.  Are female.  Are over 49 years old. What are the signs or symptoms? Symptoms of this condition include:  Knee pain, especially at the side of the knee joint. You may feel pain when the injury occurs, or you may only hear a pop and feel pain later.  A feeling that your knee is clicking, catching, locking, or giving way.  Not being able to fully bend or extend your knee.  Bruising or swelling in your knee. How is this diagnosed? This condition may be diagnosed based on your symptoms and a physical exam. You may also have tests, such as:  X-rays.  MRI.  A procedure to  look inside your knee with a narrow surgical telescope (arthroscopy). You may be referred to a knee specialist (orthopedic surgeon). How is this treated? Treatment for this injury depends on the severity of the tear. Treatment for a mild tear may include:  Rest.  Medicine to reduce pain and swelling. This is usually a nonsteroidal anti-inflammatory drug (NSAID), like ibuprofen.  A knee brace, sleeve, or wrap.  Using crutches or a walker to keep weight off your knee and to help you walk.  Exercises to strengthen your knee (physical therapy). You may need surgery if you have a severe tear or if other treatments are not working. Follow these instructions at home: If you have a brace, sleeve, or wrap:  Wear it as told by your health care provider. Remove it only as told by your health care provider.  Loosen the brace, sleeve, or wrap if your toes tingle, become numb, or turn cold and blue.  Keep the brace, sleeve, or wrap clean and dry.  If the brace, sleeve, or wrap is not waterproof: ? Do not let it get wet. ? Cover it with a watertight covering when you take a bath or shower. Managing pain and swelling   Take over-the-counter and prescription medicines only as told by your health care provider.  If directed, put ice on your knee: ? If you have a removable brace, sleeve, or wrap, remove it as told by  your health care provider. ? Put ice in a plastic bag. ? Place a towel between your skin and the bag. ? Leave the ice on for 20 minutes, 2-3 times per day.  Move your toes often to avoid stiffness and to lessen swelling.  Raise (elevate) the injured area above the level of your heart while you are sitting or lying down. Activity  Do not use the injured limb to support your body weight until your health care provider says that you can. Use crutches or a walker as told by your health care provider.  Return to your normal activities as told by your health care provider. Ask your  health care provider what activities are safe for you.  Perform range-of-motion exercises only as told by your health care provider.  Begin doing exercises to strengthen your knee and leg muscles only as told by your health care provider. After you recover, your health care provider may recommend these exercises to help prevent another injury. General instructions  Use a knee brace, sleeve, or wrap as told by your health care provider.  Ask your health care provider when it is safe to drive if you have a brace, sleeve, or wrap on your knee.  Do not use any products that contain nicotine or tobacco, such as cigarettes, e-cigarettes, and chewing tobacco. If you need help quitting, ask your health care provider.  Ask your health care provider if the medicine prescribed to you: ? Requires you to avoid driving or using heavy machinery. ? Can cause constipation. You may need to take these actions to prevent or treat constipation:  Drink enough fluid to keep your urine pale yellow.  Take over-the-counter or prescription medicines.  Eat foods that are high in fiber, such as beans, whole grains, and fresh fruits and vegetables.  Limit foods that are high in fat and processed sugars, such as fried or sweet foods.  Keep all follow-up visits as told by your health care provider. This is important. Contact a health care provider if:  You have a fever.  Your knee becomes red, tender, or swollen.  Your pain medicine is not helping.  Your symptoms get worse or do not improve after 2 weeks of home care. Summary  A meniscus tear is a knee injury that happens when a piece of the meniscus is torn.  Treatment for this injury depends on the severity of the tear. You may need surgery if you have a severe tear or if other treatments are not working.  Rest, ice, and raise (elevate) your injured knee as told by your health care provider. This will help lessen pain and swelling.  Contact a health  care provider if you have new symptoms, or your symptoms get worse or do not improve after 2 weeks of home care.  Keep all follow-up visits as told by your health care provider. This is important. This information is not intended to replace advice given to you by your health care provider. Make sure you discuss any questions you have with your health care provider. Document Revised: 08/07/2017 Document Reviewed: 08/07/2017 Elsevier Patient Education  [place].

## 2019-08-07 NOTE — Progress Notes (Signed)
Rito Ehrlich, am serving as a Education administrator for Dr. [name].  Beverly Dougherty is a 59 y.o. female who presents to [place] at [facility] today for L knee pain.  She was last seen by Dr. [name] on 02/18/19 for her R shoulder and had a R GHJ injection.  Since her last visit, pt reports knee pain x2 weeks. worse in the morning. Noticed her knee and leg issues after playing pickleball.    Knee swelling: yes medial knee  Knee mechanical symptoms:  no Aggravating factors: trying not to be too active while she has the pain  Treatments tried: ice, tylenol, elevation    Pertinent review of systems: No fevers or chills  Relevant historical information: Rheumatoid arthritis.   Exam:  BP 122/84 (BP Location: Left Arm, Patient Position: Sitting, Cuff Size: Normal)   Pulse 91   Ht 5\' 4"  (1.626 m)   Wt 136 lb (61.7 kg)   SpO2 99%   BMI 23.34 kg/m  General: Well Developed, well nourished, and in no acute distress.   MSK: Left knee mildly swollen medially.  Otherwise normal-appearing Tender palpation medial joint line. Range of motion 0-120 degrees with crepitation. Stable ligamentous exam. Positive medial McMurray's test. Intact strength.    Lab and Radiology Results  Diagnostic Limited MSK Ultrasound of: Left knee Quad tendon intact normal-appearing Moderate effusion superior patellar space. Patellar tendon normal-appearing Medial joint line narrowed degenerative.  Medial meniscus with linear fissure with small parameniscal cyst present. Lateral joint line mildly narrowed mildly degenerative. Posterior knee small amount of hypoechoic fluid tracking soft tissue consistent with ruptured Baker's cyst. 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   Procedure: Real-time Ultrasound Guided Injection of left knee lateral superior patellar space Device: Philips Affiniti 50G Images permanently  stored and available for review in the ultrasound unit. Verbal informed consent obtained.  Discussed risks and benefits of procedure. Warned about infection bleeding damage to structures skin hypopigmentation and fat atrophy among others. Patient expresses understanding and agreement Time-out conducted.   Noted no overlying erythema, induration, or other signs of local infection.   Skin prepped in a sterile fashion.   Local anesthesia: Topical Ethyl chloride.   With sterile technique and under real time ultrasound guidance:  40 mg of Kenalog and 2 mL of Marcaine injected easily.   Completed without difficulty   Pain immediately resolved suggesting accurate placement of the medication.   Advised to call if fevers/chills, erythema, induration, drainage, or persistent bleeding.   Images permanently stored and available for review in the ultrasound unit.  Impression: Technically successful ultrasound guided injection.       Assessment and Plan: 59 y.o. female with left knee pain ongoing for a few weeks now after injuring it playing pickle ball.  Fortunately no evidence of fracture on ultrasound or physical exam.  Patient likely had exacerbation of DJD and probably suffered a meniscus tear.  It also seems as though she developed a Baker's cyst which is now ruptured. She is traveling to vacation to [place] and [place] next week.  I think it is reasonable to proceed with trial of steroid injection as well as continued conservative management with compressive sleeve hinged knee brace and Voltaren gel.  Recheck back as needed.   PDMP not reviewed this encounter. Orders Placed This Encounter  Procedures  . [place] LIMITED JOINT SPACE STRUCTURES LOW LEFT    Standing Status:  Future    Number of Occurrences:   1    Standing Expiration Date:   08/06/2020    Order Specific Question:   Reason for Exam (SYMPTOM  OR DIAGNOSIS REQUIRED)    Answer:   Left knee pain    Order Specific Question:   Preferred  imaging location?    Answer:   [facility]   No orders of the defined types were placed in this encounter.    Discussed warning signs or symptoms. Please see discharge instructions. Patient expresses understanding.   The above documentation has been reviewed and is accurate and complete [name], M.D.

## 2019-08-08 DIAGNOSIS — Z20822 Contact with and (suspected) exposure to covid-19: Secondary | ICD-10-CM | POA: Diagnosis not present

## 2019-08-09 DIAGNOSIS — Z20822 Contact with and (suspected) exposure to covid-19: Secondary | ICD-10-CM | POA: Diagnosis not present

## 2019-08-22 ENCOUNTER — Telehealth: Payer: Self-pay | Admitting: Family Medicine

## 2019-08-22 DIAGNOSIS — M25562 Pain in left knee: Secondary | ICD-10-CM

## 2019-08-22 NOTE — Telephone Encounter (Signed)
Pt called, she is travelling internationally and will be back Sunday night. She really thinks it is time for an MRI and is interested in having this done next week in [place] if Dr. [name] agrees.

## 2019-08-22 NOTE — Telephone Encounter (Signed)
LVM informing patient of MD response weill start working on [employer]

## 2019-08-22 NOTE — Telephone Encounter (Signed)
Knee MRI ordered. We will work on authorization now. It skin to be a bit of a stretch to get it Sunday but it still possible.

## 2019-08-25 ENCOUNTER — Ambulatory Visit (INDEPENDENT_AMBULATORY_CARE_PROVIDER_SITE_OTHER): Payer: BC Managed Care – PPO

## 2019-08-25 ENCOUNTER — Other Ambulatory Visit: Payer: Self-pay

## 2019-08-25 DIAGNOSIS — M25562 Pain in left knee: Secondary | ICD-10-CM | POA: Diagnosis not present

## 2019-08-26 ENCOUNTER — Telehealth: Payer: Self-pay | Admitting: Family Medicine

## 2019-08-26 DIAGNOSIS — S83232A Complex tear of medial meniscus, current injury, left knee, initial encounter: Secondary | ICD-10-CM

## 2019-08-26 DIAGNOSIS — M25562 Pain in left knee: Secondary | ICD-10-CM

## 2019-08-26 NOTE — Telephone Encounter (Signed)
Pt had MRI last night. She is happy to make a follow up appt, but would like to know asap if: IS there anything seriously wrong? Can she stop wearing the brace?

## 2019-08-26 NOTE — Progress Notes (Signed)
MRI knee shows large tear of the medial meniscus, cartilage arthritis, and partial thickness tear of the proximal calf muscle gastrocnemius.  This probably will require surgery.  Do have a surgeon in mind?  Reasonable to schedule follow-up appoint with me in person to go over the results in detail.

## 2019-08-27 NOTE — Telephone Encounter (Signed)
Pt called back asking what her limitations are until she is able to see ortho? Can she walk the dog? Go swimming?

## 2019-08-27 NOTE — Telephone Encounter (Signed)
Referral to [facility] for surgical consultation.  You should hear soon about the appointment.

## 2019-08-27 NOTE — Telephone Encounter (Signed)
Called pt and relayed info that a referral has been sent to [facility] for her knee.  Advised pt that Beverly Dougherty will make a visit w/ whichever provider is most appropriate for her knee issue but feel that it will likely be Dr. Marlou Sa or Dr. Erlinda Hong.

## 2019-08-28 NOTE — Telephone Encounter (Signed)
Activity as tolerated. Walking and swimming should be ok.

## 2019-08-28 NOTE — Telephone Encounter (Signed)
Called pt and relayed Dr. Clovis Riley advice regarding activity limitations.  Pt verbalizes understanding.

## 2019-08-30 ENCOUNTER — Other Ambulatory Visit: Payer: BC Managed Care – PPO

## 2019-09-03 ENCOUNTER — Ambulatory Visit: Payer: BC Managed Care – PPO | Admitting: Orthopaedic Surgery

## 2019-09-03 ENCOUNTER — Encounter: Payer: Self-pay | Admitting: Orthopaedic Surgery

## 2019-09-03 DIAGNOSIS — S83242A Other tear of medial meniscus, current injury, left knee, initial encounter: Secondary | ICD-10-CM

## 2019-09-03 NOTE — Progress Notes (Signed)
Office Visit Note   Patient: Beverly Dougherty           Date of Birth: 08-Jun-1960           MRN: [id number] Visit Date: 09/03/2019              Requested by: Gregor Hams, MD [place],  [place] [id number] PCP: Derinda Late, MD   Assessment & Plan: Visit Diagnoses:  1. Acute medial meniscus tear of left knee, initial encounter     Plan: I reviewed the MRI of the left knee which does show a complex tear of the medial meniscus with slight extrusion.  She has minimal chondromalacia.  I discussed that the gastroc tear will heal without requiring surgery.  After our discussion on her treatment options and given her active lifestyle she has decided to move forward with scheduling for a left partial medial meniscectomy.  Risk benefits rehab recovery reviewed in detail.  Questions encouraged and answered.  Follow-Up Instructions: Return if symptoms worsen or fail to improve.   Orders:  No orders of the defined types were placed in this encounter.  No orders of the defined types were placed in this encounter.     Procedures: No procedures performed   Clinical Data: No additional findings.   Subjective: Chief Complaint  Patient presents with  . Left Knee - Pain    Beverly Dougherty is a very pleasant 59 year old female who comes in today with her mother for acute left medial meniscal tear found on recent MRI.  She is referral from [place].  She states that she had a possible twisting injury while playing pickle ball about 5 weeks ago.  She subsequently had a cortisone injection at Dr. Clovis Riley office which was partially helpful.  She then reinjured it when she was traveling in [place].  She takes meloxicam every morning for the pain.  She has tried RICE as well.  She states the pain is not chronic but is unpredictable and will cause her knee to give way.  She has a known diagnosis of rheumatoid arthritis for which she is on Plaquenil and meloxicam.  She occasionally takes  hydrocodone at night.   Review of Systems  Constitutional: Negative.   HENT: Negative.   Eyes: Negative.   Respiratory: Negative.   Cardiovascular: Negative.   Endocrine: Negative.   Musculoskeletal: Negative.   Neurological: Negative.   Hematological: Negative.   Psychiatric/Behavioral: Negative.   All other systems reviewed and are negative.    Objective: Vital Signs: There were no vitals taken for this visit.  Physical Exam Vitals and nursing note reviewed.  Constitutional:      Appearance: She is well-developed.  HENT:     Head: Normocephalic and atraumatic.  Pulmonary:     Effort: Pulmonary effort is normal.  Abdominal:     Palpations: Abdomen is soft.  Musculoskeletal:     Cervical back: Neck supple.  Skin:    General: Skin is warm.     Capillary Refill: Capillary refill takes less than 2 seconds.  Neurological:     Mental Status: She is alert and oriented to person, place, and time.  Psychiatric:        Behavior: Behavior normal.        Thought Content: Thought content normal.        Judgment: Judgment normal.     Ortho Exam Left knee shows tenderness along the medial joint line.  No joint effusion.  Pain with McMurray  testing at the medial joint line.  Collaterals and cruciates are stable. Specialty Comments:  No specialty comments available.  Imaging: No results found.   PMFS History: Patient Active Problem List   Diagnosis Date Noted  . Acute medial meniscus tear of left knee 09/03/2019  . Ganglion cyst of finger of left hand 03/08/2018  . Rheumatoid arthritis with positive rheumatoid factor ([place]) 12/19/2017  . Polyarthralgia 11/21/2016  . Shoulder arthritis 11/21/2016  . Sacroiliitis ([place]) 11/11/2015  . [place] arthritis 07/07/2015  . OA (osteoarthritis) of finger, right 06/01/2015  . Osteoarthritis of finger of left hand 06/01/2015  . Right trigger finger 06/01/2015   Past Medical History:  Diagnosis Date  . Insomnia    related to pain  from osteoarthritis  . Osteoarthritis    bilat thumbs and hips    Family History  Problem Relation Age of Onset  . Colon cancer Neg Hx     Past Surgical History:  Procedure Laterality Date  . BREAST ENHANCEMENT SURGERY    . CERVICAL FUSION    . HERNIA REPAIR    . TONSILLECTOMY    . URETHRAL FISTULA REPAIR     after childbirth  . WISDOM TOOTH EXTRACTION     Social History   Occupational History  . Not on file  Tobacco Use  . Smoking status: Former Smoker    Packs/day: 0.50    Years: 5.00    Pack years: 2.50    Types: Cigarettes    Quit date: 09/25/1984    Years since quitting: 34.9  . Smokeless tobacco: Never Used  Substance and Sexual Activity  . Alcohol use: Yes    Comment: socially  . Drug use: No  . Sexual activity: Not on file

## 2019-09-22 DIAGNOSIS — Z01419 Encounter for gynecological examination (general) (routine) without abnormal findings: Secondary | ICD-10-CM | POA: Diagnosis not present

## 2019-09-22 DIAGNOSIS — Z1231 Encounter for screening mammogram for malignant neoplasm of breast: Secondary | ICD-10-CM | POA: Diagnosis not present

## 2019-09-22 DIAGNOSIS — Z6822 Body mass index (BMI) 22.0-22.9, adult: Secondary | ICD-10-CM | POA: Diagnosis not present

## 2019-09-22 DIAGNOSIS — Z124 Encounter for screening for malignant neoplasm of cervix: Secondary | ICD-10-CM | POA: Diagnosis not present

## 2019-09-29 DIAGNOSIS — M25562 Pain in left knee: Secondary | ICD-10-CM | POA: Diagnosis not present

## 2019-09-29 DIAGNOSIS — S83242A Other tear of medial meniscus, current injury, left knee, initial encounter: Secondary | ICD-10-CM | POA: Diagnosis not present

## 2019-09-29 DIAGNOSIS — M1712 Unilateral primary osteoarthritis, left knee: Secondary | ICD-10-CM | POA: Diagnosis not present

## 2019-10-03 DIAGNOSIS — M150 Primary generalized (osteo)arthritis: Secondary | ICD-10-CM | POA: Diagnosis not present

## 2019-10-03 DIAGNOSIS — M2550 Pain in unspecified joint: Secondary | ICD-10-CM | POA: Diagnosis not present

## 2019-10-12 DIAGNOSIS — Z20822 Contact with and (suspected) exposure to covid-19: Secondary | ICD-10-CM | POA: Diagnosis not present

## 2019-10-20 DIAGNOSIS — S83242D Other tear of medial meniscus, current injury, left knee, subsequent encounter: Secondary | ICD-10-CM | POA: Diagnosis not present

## 2019-10-24 ENCOUNTER — Inpatient Hospital Stay: Payer: BC Managed Care – PPO | Admitting: Orthopaedic Surgery

## 2019-11-05 ENCOUNTER — Encounter: Payer: Self-pay | Admitting: Family Medicine

## 2019-11-05 ENCOUNTER — Ambulatory Visit: Payer: Self-pay

## 2019-11-05 ENCOUNTER — Ambulatory Visit (INDEPENDENT_AMBULATORY_CARE_PROVIDER_SITE_OTHER): Payer: BC Managed Care – PPO | Admitting: Family Medicine

## 2019-11-05 ENCOUNTER — Other Ambulatory Visit: Payer: Self-pay

## 2019-11-05 VITALS — BP 130/90 | HR 91 | Ht 64.0 in | Wt 136.6 lb

## 2019-11-05 DIAGNOSIS — S83232A Complex tear of medial meniscus, current injury, left knee, initial encounter: Secondary | ICD-10-CM | POA: Diagnosis not present

## 2019-11-05 NOTE — Progress Notes (Signed)
I, Beverly Dougherty, LAT, ATC, am serving as scribe for Dr. Lynne Dougherty.  Beverly Dougherty is a 59 y.o. female who presents to [place] at [facility] today for f/u of L knee and medial meniscus tear.  She was last seen by Dr. [name] on 08/07/19 and referred for a L knee MRI and then to ortho due to a large oblique tear of the posterior horn of the medial meniscus per MRI.  Pt saw Dr. Erlinda Hong at [facility] on 09/03/19 who reviewed her L knee MRI.  She then went to see Dr. Theda Sers for another opinion.  Since then, pt reports that she feels like her L leg is getting weaker due to her decreased activity level secondary to fear of how her L knee will do.  She has been wearing a L knee brace.  She is currently scheduled for knee surgery w/ Dr. Theda Sers..  She notes she no longer is having mechanical symptoms in her knee.  Diagnostic testing: L knee MRI- 08/25/19  Pertinent review of systems: No fevers or chills   Relevant historical information: Rheumatoid arthritis   Exam:  BP 130/90 (BP Location: Right Arm, Patient Position: Sitting, Cuff Size: Normal)   Pulse 91   Ht 5\' 4"  (1.626 m)   Wt 136 lb 9.6 oz (62 kg)   SpO2 92%   BMI 23.45 kg/m  General: Well Developed, well nourished, and in no acute distress.   MSK: Left knee normal-appearing tender palpation mildly medial joint line normal knee motion.  Stable ligamentous exam.    Lab and Radiology Results MR Knee Left  Wo Contrast  Result Date: 08/26/2019 CLINICAL DATA:  Acute left knee pain EXAM: MRI OF THE LEFT KNEE WITHOUT CONTRAST TECHNIQUE: Multiplanar, multisequence MR imaging of the knee was performed. No intravenous contrast was administered. COMPARISON:  None. FINDINGS: MENISCI Medial meniscus: Large oblique tear of the body and posterior horn of the medial meniscus extend to the inferior articular surface. Lateral meniscus:  Intact. LIGAMENTS Cruciates:  Intact ACL and PCL. Collaterals: Medial collateral ligament is intact.  Lateral collateral ligament complex is intact. CARTILAGE Patellofemoral: Partial-thickness cartilage loss of the patellofemoral compartment. Medial: Partial-thickness cartilage loss of the medial femorotibial compartment. Lateral: Mild partial-thickness cartilage loss of the lateral femorotibial compartment. Joint: Small joint effusion. Mild edema in superolateral Hoffa's fat. No plical thickening. Popliteal Fossa:  Tiny Baker's cyst.  Intact popliteus tendon. Extensor Mechanism: Intact quadriceps tendon. Intact patellar tendon. Intact medial patellar retinaculum. Intact lateral patellar retinaculum. Intact MPFL. Bones:  No acute osseous abnormality.  No aggressive osseous lesion. Other: Large high-grade partial thickness tear of the proximal gastrocnemius muscle at the musculotendinous junction. No muscle atrophy. IMPRESSION: 1. Large oblique tear of the body and posterior horn of the medial meniscus extend to the inferior articular surface. 2. Tricompartmental cartilage abnormalities as described above. 3. Large high-grade partial thickness tear of the proximal gastrocnemius muscle at the musculotendinous junction. Electronically Signed   By: Kathreen Devoid   On: 08/26/2019 07:56   I, Beverly Dougherty, personally (independently) visualized and performed the interpretation of the images attached in this note.     Assessment and Plan: 59 y.o. female with Left knee pain due to meniscus tear and also due to developing degenerative changes.  Originally was having more mechanical symptoms.  She delayed surgery and wanted a second opinion.  She got some conflicting advice from orthopedic surgeons.  She was recommended to have surgery by Dr. Erlinda Hong in college recommended  a trial of conservative management first.  Based on the paucity of her mechanical symptoms I think is reasonable to proceed with conservative management.  However if her symptoms worsen it may be worthwhile to proceed with arthroscopic debridement of her  meniscus.  Noted that regardless of whether or not she has a meniscus surgery or not she very likely will have developing arthritis in that knee earlier that she otherwise would and ultimately will likely require total knee replacement.  She will think about it but is leaning away from surgery at this time.   PDMP not reviewed this encounter. Orders Placed This Encounter  Procedures  . [place] LIMITED JOINT SPACE STRUCTURES LOW LEFT(NO LINKED CHARGES)    Order Specific Question:   Reason for Exam (SYMPTOM  OR DIAGNOSIS REQUIRED)    Answer:   L knee pain    Order Specific Question:   Preferred imaging location?    Answer:   [facility]   No orders of the defined types were placed in this encounter.    Discussed warning signs or symptoms. Please see discharge instructions. Patient expresses understanding.   The above documentation has been reviewed and is accurate and complete Beverly Dougherty, M.D.  Total encounter time 20 minutes including face-to-face time with the patient and, reviewing past medical record, and charting on the date of service.   Discussed surgery options

## 2019-11-05 NOTE — Patient Instructions (Signed)
Thank you for coming in today.  I think this is ok to live your life.  Eventually you will need a knee replacement.   Can do injection in the future if needed.   Recheck as needed.

## 2019-12-02 DIAGNOSIS — Z87891 Personal history of nicotine dependence: Secondary | ICD-10-CM | POA: Diagnosis not present

## 2019-12-02 DIAGNOSIS — X500XXA Overexertion from strenuous movement or load, initial encounter: Secondary | ICD-10-CM | POA: Diagnosis not present

## 2019-12-02 DIAGNOSIS — R0781 Pleurodynia: Secondary | ICD-10-CM | POA: Diagnosis not present

## 2019-12-09 DIAGNOSIS — Z79899 Other long term (current) drug therapy: Secondary | ICD-10-CM | POA: Diagnosis not present

## 2019-12-09 DIAGNOSIS — M0609 Rheumatoid arthritis without rheumatoid factor, multiple sites: Secondary | ICD-10-CM | POA: Diagnosis not present

## 2020-02-03 DIAGNOSIS — M9901 Segmental and somatic dysfunction of cervical region: Secondary | ICD-10-CM | POA: Diagnosis not present

## 2020-02-03 DIAGNOSIS — M9902 Segmental and somatic dysfunction of thoracic region: Secondary | ICD-10-CM | POA: Diagnosis not present

## 2020-02-03 DIAGNOSIS — M9907 Segmental and somatic dysfunction of upper extremity: Secondary | ICD-10-CM | POA: Diagnosis not present

## 2020-02-03 DIAGNOSIS — M7541 Impingement syndrome of right shoulder: Secondary | ICD-10-CM | POA: Diagnosis not present

## 2020-02-04 DIAGNOSIS — M9902 Segmental and somatic dysfunction of thoracic region: Secondary | ICD-10-CM | POA: Diagnosis not present

## 2020-02-04 DIAGNOSIS — M9907 Segmental and somatic dysfunction of upper extremity: Secondary | ICD-10-CM | POA: Diagnosis not present

## 2020-02-04 DIAGNOSIS — M7541 Impingement syndrome of right shoulder: Secondary | ICD-10-CM | POA: Diagnosis not present

## 2020-02-04 DIAGNOSIS — M9901 Segmental and somatic dysfunction of cervical region: Secondary | ICD-10-CM | POA: Diagnosis not present

## 2020-06-16 NOTE — Progress Notes (Signed)
I, [name], LAT, ATC, am serving as scribe for Dr. [name].  Beverly Dougherty is a 60 y.o. female who presents to [place] at [facility] today for f/u B shoulder pain, L>R.  She was last seen by Dr. [name] on 11/05/19 for a L knee meniscus tear.  Since then, she notes acute flare of chronic B shoulder, L>R, pain x approximately 3 weeks.  She states that she's been doing a lot of activity including pickleball, hiking and activity such as pressure washing and painting.  She locates her pain to her L and R superior-lateral shoulder that radiates into her L upper arm and L lateral neck.  She reports aching pain.  Radiating pain: yes into her B upper arms and L lateral neck Shoulder mechanical symptoms: No Aggravating factors: laying on either side; most bothersome at night when she's trying to sleep; trying to be at rest Treatments tried: exercise; Voltaren gel; lidocaine patches  Diagnostic imaging: R shoulder and C-spine XR- 11/21/16  Pertinent review of systems: No fevers or chills  Relevant historical information: Osteoarthritis and rheumatoid arthritis.   Exam:  BP 110/84 (BP Location: Left Arm, Patient Position: Sitting, Cuff Size: Normal)   Ht 5\' 4"  (1.626 m)   Wt 137 lb 3.2 oz (62.2 kg)   BMI 23.55 kg/m  General: Well Developed, well nourished, and in no acute distress.   MSK: Bilateral shoulders normal-appearing normal motion pain with abduction.  Intact strength mildly positive impingement testing.    Lab and Radiology Results  Procedure: Real-time Ultrasound Guided Injection of left shoulder glenohumeral joint posterior approach Device: Philips Affiniti 50G Images permanently stored and available for review in PACS Joint effusion seen on ultrasound examination prior to injection Verbal informed consent obtained.  Discussed risks and benefits of procedure. Warned about infection bleeding damage to structures skin hypopigmentation and fat atrophy  among others. Patient expresses understanding and agreement Time-out conducted.   Noted no overlying erythema, induration, or other signs of local infection.   Skin prepped in a sterile fashion.   Local anesthesia: Topical Ethyl chloride.   With sterile technique and under real time ultrasound guidance:  40 mg of Kenalog and 2 mL of Marcaine injected into joint . Fluid seen entering the joint capsul.   Completed without difficulty   Pain immediately resolved suggesting accurate placement of the medication.   Advised to call if fevers/chills, erythema, induration, drainage, or persistent bleeding.   Images permanently stored and available for review in the ultrasound unit.  Impression: Technically successful ultrasound guided injection.   Procedure: Real-time Ultrasound Guided Injection of right shoulder glenohumeral joint posterior approach Device: Philips Affiniti 50G Images permanently stored and available for review in PACS Verbal informed consent obtained.  Discussed risks and benefits of procedure. Warned about infection bleeding damage to structures skin hypopigmentation and fat atrophy among others. Patient expresses understanding and agreement Time-out conducted.   Noted no overlying erythema, induration, or other signs of local infection.   Skin prepped in a sterile fashion.   Local anesthesia: Topical Ethyl chloride.   With sterile technique and under real time ultrasound guidance:  2 mg of Kenalog and 2 mL of Marcaine injected into shoulder joint. Fluid seen entering the joint capsule.   Completed without difficulty   Pain immediately resolved suggesting accurate placement of the medication.   Advised to call if fevers/chills, erythema, induration, drainage, or persistent bleeding.   Images permanently stored and available for review in the ultrasound  unit.  Impression: Technically successful ultrasound guided injection.       Assessment and Plan: 60 y.o. female with  bilateral shoulder pain due to rheumatoid arthritis and some rotator cuff tendinopathy.  Plan for bilateral shoulder glenohumeral injections.  She had good benefit in the past with right glenohumeral shoulder injection.  Plan also for home exercise program focused on rotator cuff strengthening shoulder stabilization.  Consider formal physical therapy referral as well.  Recheck back with me as needed.   PDMP not reviewed this encounter. Orders Placed This Encounter  Procedures  . [place] LIMITED JOINT SPACE STRUCTURES UP BILAT(NO LINKED CHARGES)    Order Specific Question:   Reason for Exam (SYMPTOM  OR DIAGNOSIS REQUIRED)    Answer:   B shoulder pain    Order Specific Question:   Preferred imaging location?    Answer:   [place]   No orders of the defined types were placed in this encounter.    Discussed warning signs or symptoms. Please see discharge instructions. Patient expresses understanding.   The above documentation has been reviewed and is accurate and complete [name], M.D.

## 2020-06-17 ENCOUNTER — Encounter: Payer: Self-pay | Admitting: Family Medicine

## 2020-06-17 ENCOUNTER — Ambulatory Visit (INDEPENDENT_AMBULATORY_CARE_PROVIDER_SITE_OTHER): Payer: 59 | Admitting: Family Medicine

## 2020-06-17 ENCOUNTER — Ambulatory Visit: Payer: Self-pay

## 2020-06-17 ENCOUNTER — Other Ambulatory Visit: Payer: Self-pay

## 2020-06-17 VITALS — BP 110/84 | Ht 64.0 in | Wt 137.2 lb

## 2020-06-17 DIAGNOSIS — M25512 Pain in left shoulder: Secondary | ICD-10-CM | POA: Diagnosis not present

## 2020-06-17 DIAGNOSIS — M25511 Pain in right shoulder: Secondary | ICD-10-CM | POA: Diagnosis not present

## 2020-06-17 NOTE — Patient Instructions (Addendum)
Thank you for coming in today.  Call or go to the ER if you develop a large red swollen joint with extreme pain or oozing puss.   Recheck with me as needed.   Continue the home exercises we taught you today.   Please perform the exercise program that we have prepared for you and gone over in detail on a daily basis.  In addition to the handout you were provided you can access your program through: [email or web address]   Your unique program code is:  Beverly Dougherty   Let me know if you want PT order.   Please use Voltaren gel (Generic Diclofenac Gel) up to 4x daily for pain as needed.  This is available over-the-counter as both the name brand Voltaren gel and the generic diclofenac gel.

## 2020-09-07 ENCOUNTER — Ambulatory Visit: Payer: No Typology Code available for payment source | Admitting: Family Medicine

## 2020-09-07 NOTE — Progress Notes (Signed)
I, Beverly Dougherty, LAT, ATC, am serving as scribe for Dr. [name].  Beverly Dougherty is a 60 y.o. female who presents to [place] at [facility] today for R shoulder and L knee pain f/u.  She was last seen by Dr. [name] on 06/17/20 for B shoulder pain, L>R, and prior to that on 11/05/19 for a L knee meniscal tear.  At her last visit, she had B GHJ injections and was shown a HEP focusing on rotator cuff and peri-scapular strengthening.  Since her last visit, pt reports that her R shoulder and L medial knee are bothering her.  She is worried about an upcoming vacation in which she will be doing a biking trip in [place].  Her R shoulder has been giving her a "fit" for about 3 weeks.  She did well w/ the B GHJ injections at her last visit until recently.  She is having the most R shoulder pain trying to sleep and w/ R shoulder AROM past 90 deg, especially into aBd.  She locates her pain to her R post shoulder.  Diagnostic testing: R shoulder and c-spine XR- 11/21/16  Pertinent review of systems: No fevers or chills  Relevant historical information: Rheumatoid arthritis   Exam:  BP 110/78 (BP Location: Left Arm, Patient Position: Sitting, Cuff Size: Normal)   Pulse 86   Ht '5\' 4"'$  (1.626 m)   Wt 131 lb 3.2 oz (59.5 kg)   SpO2 98%   BMI 22.52 kg/m  General: Well Developed, well nourished, and in no acute distress.   MSK: Right shoulder normal-appearing nontender decreased shoulder range of motion. Left knee tender palpation medial joint line normal knee motion.    Lab and Radiology Results  Procedure: Real-time Ultrasound Guided Injection of left knee superior patellar space Device: Philips Affiniti 50G Images permanently stored and available for review in PACS Verbal informed consent obtained.  Discussed risks and benefits of procedure. Warned about infection bleeding damage to structures skin hypopigmentation and fat atrophy among others. Patient expresses understanding  and agreement Time-out conducted.   Noted no overlying erythema, induration, or other signs of local infection.   Skin prepped in a sterile fashion.   Local anesthesia: Topical Ethyl chloride.   With sterile technique and under real time ultrasound guidance: 40 mg of Kenalog and 2 mL of Marcaine injected into knee joint. Fluid seen entering the joint capsule.   Completed without difficulty   Pain immediately resolved suggesting accurate placement of the medication.   Advised to call if fevers/chills, erythema, induration, drainage, or persistent bleeding.   Images permanently stored and available for review in the ultrasound unit.  Impression: Technically successful ultrasound guided injection.    Procedure: Real-time Ultrasound Guided Injection of right shoulder glenohumeral joint posterior approach Device: Philips Affiniti 50G Images permanently stored and available for review in PACS Verbal informed consent obtained.  Discussed risks and benefits of procedure. Warned about infection bleeding damage to structures skin hypopigmentation and fat atrophy among others. Patient expresses understanding and agreement Time-out conducted.   Noted no overlying erythema, induration, or other signs of local infection.   Skin prepped in a sterile fashion.   Local anesthesia: Topical Ethyl chloride.   With sterile technique and under real time ultrasound guidance: 40 mg of Kenalog and 2 mL of Marcaine injected into shoulder joint. Fluid seen entering the joint capsule.   Completed without difficulty   Pain immediately resolved suggesting accurate placement of the medication.   Advised  to call if fevers/chills, erythema, induration, drainage, or persistent bleeding.   Images permanently stored and available for review in the ultrasound unit.  Impression: Technically successful ultrasound guided injection.       Assessment and Plan: 60 y.o. female with right shoulder pain recurrent issue.   Patient has done well with previous treatment with glenohumeral joint injection.  Plan for repeat injection now.  She has an upcoming bike trip and will be reliant on that shoulder quite a bit.  Left knee pain.  Patient has a history of a small meniscus injury and that knee as well.  The knee is mildly bothersome.  She is more worried about it hurting worse on the bike trip then and it is hurting right now.  I think it is reasonable to proceed with a steroid injection as well.  Recheck if not improved in the near future.   PDMP not reviewed this encounter. Orders Placed This Encounter  Procedures   [place] LIMITED JOINT SPACE STRUCTURES LOW LEFT(NO LINKED CHARGES)    Order Specific Question:   Reason for Exam (SYMPTOM  OR DIAGNOSIS REQUIRED)    Answer:   L knee pain    Order Specific Question:   Preferred imaging location?    Answer:   [facility]   No orders of the defined types were placed in this encounter.    Discussed warning signs or symptoms. Please see discharge instructions. Patient expresses understanding.   Rapid test

## 2020-09-08 ENCOUNTER — Ambulatory Visit (INDEPENDENT_AMBULATORY_CARE_PROVIDER_SITE_OTHER): Payer: No Typology Code available for payment source | Admitting: Family Medicine

## 2020-09-08 ENCOUNTER — Encounter: Payer: Self-pay | Admitting: Family Medicine

## 2020-09-08 ENCOUNTER — Ambulatory Visit: Payer: Self-pay

## 2020-09-08 ENCOUNTER — Other Ambulatory Visit: Payer: Self-pay

## 2020-09-08 VITALS — BP 110/78 | HR 86 | Ht 64.0 in | Wt 131.2 lb

## 2020-09-08 DIAGNOSIS — M25511 Pain in right shoulder: Secondary | ICD-10-CM | POA: Diagnosis not present

## 2020-09-08 DIAGNOSIS — M19019 Primary osteoarthritis, unspecified shoulder: Secondary | ICD-10-CM

## 2020-09-08 DIAGNOSIS — M25562 Pain in left knee: Secondary | ICD-10-CM | POA: Diagnosis not present

## 2020-09-08 DIAGNOSIS — G8929 Other chronic pain: Secondary | ICD-10-CM | POA: Diagnosis not present

## 2020-09-08 NOTE — Patient Instructions (Signed)
Thank you for coming in today.   Enjoy the trip.   Let me know if you need anything.   Recheck as needed.

## 2020-10-05 ENCOUNTER — Emergency Department (HOSPITAL_COMMUNITY): Payer: No Typology Code available for payment source

## 2020-10-05 ENCOUNTER — Inpatient Hospital Stay (HOSPITAL_COMMUNITY)
Admission: EM | Admit: 2020-10-05 | Discharge: 2020-10-09 | DRG: 643 | Disposition: A | Discharge status: Home / Self Care | Payer: No Typology Code available for payment source | Attending: Family Medicine | Admitting: Family Medicine

## 2020-10-05 ENCOUNTER — Inpatient Hospital Stay (HOSPITAL_COMMUNITY): Payer: No Typology Code available for payment source

## 2020-10-05 ENCOUNTER — Encounter (HOSPITAL_COMMUNITY): Payer: Self-pay | Admitting: Pulmonary Disease

## 2020-10-05 DIAGNOSIS — M069 Rheumatoid arthritis, unspecified: Secondary | ICD-10-CM | POA: Diagnosis present

## 2020-10-05 DIAGNOSIS — Z20822 Contact with and (suspected) exposure to covid-19: Secondary | ICD-10-CM | POA: Diagnosis present

## 2020-10-05 DIAGNOSIS — E871 Hypo-osmolality and hyponatremia: Secondary | ICD-10-CM | POA: Diagnosis present

## 2020-10-05 DIAGNOSIS — M1990 Unspecified osteoarthritis, unspecified site: Secondary | ICD-10-CM | POA: Diagnosis present

## 2020-10-05 DIAGNOSIS — Z79899 Other long term (current) drug therapy: Secondary | ICD-10-CM | POA: Diagnosis not present

## 2020-10-05 DIAGNOSIS — R569 Unspecified convulsions: Secondary | ICD-10-CM | POA: Diagnosis present

## 2020-10-05 DIAGNOSIS — Z791 Long term (current) use of non-steroidal anti-inflammatories (NSAID): Secondary | ICD-10-CM

## 2020-10-05 DIAGNOSIS — F1010 Alcohol abuse, uncomplicated: Secondary | ICD-10-CM | POA: Diagnosis present

## 2020-10-05 DIAGNOSIS — G9341 Metabolic encephalopathy: Secondary | ICD-10-CM | POA: Diagnosis present

## 2020-10-05 DIAGNOSIS — B373 Candidiasis of vulva and vagina: Secondary | ICD-10-CM | POA: Diagnosis present

## 2020-10-05 DIAGNOSIS — Z981 Arthrodesis status: Secondary | ICD-10-CM | POA: Diagnosis not present

## 2020-10-05 DIAGNOSIS — R109 Unspecified abdominal pain: Secondary | ICD-10-CM

## 2020-10-05 DIAGNOSIS — R4182 Altered mental status, unspecified: Secondary | ICD-10-CM

## 2020-10-05 DIAGNOSIS — Z87891 Personal history of nicotine dependence: Secondary | ICD-10-CM | POA: Diagnosis not present

## 2020-10-05 DIAGNOSIS — I10 Essential (primary) hypertension: Secondary | ICD-10-CM | POA: Diagnosis present

## 2020-10-05 DIAGNOSIS — E222 Syndrome of inappropriate secretion of antidiuretic hormone: Principal | ICD-10-CM | POA: Diagnosis present

## 2020-10-05 DIAGNOSIS — E861 Hypovolemia: Secondary | ICD-10-CM | POA: Diagnosis present

## 2020-10-05 LAB — CBC
HCT: 29.8 % — ABNORMAL LOW (ref 36.0–46.0)
Hemoglobin: 10.3 g/dL — ABNORMAL LOW (ref 12.0–15.0)
MCH: 33 pg (ref 26.0–34.0)
MCHC: 34.6 g/dL (ref 30.0–36.0)
MCV: 95.5 fL (ref 80.0–100.0)
Platelets: 401 10*3/uL — ABNORMAL HIGH (ref 150–400)
RBC: 3.12 MIL/uL — ABNORMAL LOW (ref 3.87–5.11)
RDW: 11.9 % (ref 11.5–15.5)
WBC: 4.8 10*3/uL (ref 4.0–10.5)
nRBC: 0 % (ref 0.0–0.2)

## 2020-10-05 LAB — MAGNESIUM: Magnesium: 1.8 mg/dL (ref 1.7–2.4)

## 2020-10-05 LAB — COMPREHENSIVE METABOLIC PANEL
ALT: 31 U/L (ref 0–44)
AST: 31 U/L (ref 15–41)
Albumin: 3.6 g/dL (ref 3.5–5.0)
Alkaline Phosphatase: 55 U/L (ref 38–126)
Anion gap: 8 (ref 5–15)
BUN: 6 mg/dL (ref 6–20)
CO2: 22 mmol/L (ref 22–32)
Calcium: 8.9 mg/dL (ref 8.9–10.3)
Chloride: 92 mmol/L — ABNORMAL LOW (ref 98–111)
Creatinine, Ser: 0.81 mg/dL (ref 0.44–1.00)
GFR, Estimated: >60 mL/min (ref >60)
Glucose, Bld: 110 mg/dL — ABNORMAL HIGH (ref 70–99)
Potassium: 4.1 mmol/L (ref 3.5–5.1)
Sodium: 122 mmol/L — ABNORMAL LOW (ref 135–145)
Total Bilirubin: 0.8 mg/dL (ref 0.3–1.2)
Total Protein: 6.1 g/dL — ABNORMAL LOW (ref 6.5–8.1)

## 2020-10-05 LAB — RAPID URINE DRUG SCREEN, HOSP PERFORMED
Amphetamines: NOT DETECTED
Barbiturates: NOT DETECTED
Benzodiazepines: POSITIVE — AB
Cocaine: NOT DETECTED
Opiates: POSITIVE — AB
Tetrahydrocannabinol: NOT DETECTED

## 2020-10-05 LAB — SODIUM
Sodium: 126 mmol/L — ABNORMAL LOW (ref 135–145)
Sodium: 127 mmol/L — ABNORMAL LOW (ref 135–145)

## 2020-10-05 LAB — ETHANOL: Alcohol, Ethyl (B): <10 mg/dL (ref <10)

## 2020-10-05 LAB — AMMONIA: Ammonia: 12 umol/L (ref 9–35)

## 2020-10-05 LAB — RESP PANEL BY RT-PCR (FLU A&B, COVID) ARPGX2
Influenza A by PCR: NEGATIVE
Influenza B by PCR: NEGATIVE
SARS Coronavirus 2 by RT PCR: NEGATIVE

## 2020-10-05 LAB — CBG MONITORING, ED: Glucose-Capillary: 111 mg/dL — ABNORMAL HIGH (ref 70–99)

## 2020-10-05 LAB — I-STAT BETA HCG BLOOD, ED (MC, WL, AP ONLY): I-stat hCG, quantitative: <5 m[IU]/mL (ref <5)

## 2020-10-05 LAB — HIV ANTIBODY (ROUTINE TESTING W REFLEX): HIV Screen 4th Generation wRfx: NONREACTIVE

## 2020-10-05 LAB — MRSA NEXT GEN BY PCR, NASAL: MRSA by PCR Next Gen: NOT DETECTED

## 2020-10-05 MED ORDER — LORAZEPAM 2 MG/ML IJ SOLN: 2.0000 mg | INTRAMUSCULAR | Status: DC | PRN

## 2020-10-05 MED ORDER — POLYETHYLENE GLYCOL 3350 17 G PO PACK: 17.0000 g | PACK | Freq: Every day | ORAL | Status: DC | PRN

## 2020-10-05 MED ORDER — MELOXICAM 7.5 MG PO TABS
15.0000 mg | ORAL_TABLET | Freq: Every day | ORAL | Status: DC
  Administered 2020-10-05: 15 mg via ORAL
  Filled 2020-10-05: qty 2

## 2020-10-05 MED ORDER — HYDROXYCHLOROQUINE SULFATE 200 MG PO TABS
100.0000 mg | ORAL_TABLET | Freq: Every evening | ORAL | Status: DC
  Administered 2020-10-05 – 2020-10-08 (×4): 100 mg via ORAL
  Filled 2020-10-05 (×4): qty 0.5

## 2020-10-05 MED ORDER — SODIUM CHLORIDE 3 % IV BOLUS
100.0000 mL | Freq: Once | INTRAVENOUS | Status: AC
  Administered 2020-10-05: 100 mL via INTRAVENOUS
  Filled 2020-10-05: qty 500

## 2020-10-05 MED ORDER — LIP MEDEX EX OINT
TOPICAL_OINTMENT | CUTANEOUS | Status: DC | PRN
  Filled 2020-10-05: qty 7

## 2020-10-05 MED ORDER — ENOXAPARIN SODIUM 40 MG/0.4ML IJ SOSY
40.0000 mg | PREFILLED_SYRINGE | INTRAMUSCULAR | Status: DC
  Administered 2020-10-05 – 2020-10-08 (×4): 40 mg via SUBCUTANEOUS
  Filled 2020-10-05 (×4): qty 0.4

## 2020-10-05 MED ORDER — THIAMINE HCL 100 MG/ML IJ SOLN
100.0000 mg | Freq: Every day | INTRAMUSCULAR | Status: DC
  Administered 2020-10-06: 100 mg via INTRAVENOUS
  Filled 2020-10-05: qty 2

## 2020-10-05 MED ORDER — PANTOPRAZOLE SODIUM 40 MG IV SOLR
40.0000 mg | Freq: Every day | INTRAVENOUS | Status: DC
  Administered 2020-10-05: 40 mg via INTRAVENOUS
  Filled 2020-10-05: qty 40

## 2020-10-05 MED ORDER — HYDROXYCHLOROQUINE SULFATE 200 MG PO TABS
200.0000 mg | ORAL_TABLET | Freq: Every morning | ORAL | Status: DC
  Administered 2020-10-06 – 2020-10-09 (×4): 200 mg via ORAL
  Filled 2020-10-05 (×4): qty 1

## 2020-10-05 MED ORDER — FOLIC ACID 5 MG/ML IJ SOLN
1.0000 mg | Freq: Once | INTRAMUSCULAR | Status: AC
  Administered 2020-10-05: 1 mg via INTRAVENOUS
  Filled 2020-10-05: qty 0.2

## 2020-10-05 MED ORDER — SODIUM CHLORIDE 0.9 % IV SOLN: 1.0000 mg | Freq: Once | INTRAVENOUS | Status: DC

## 2020-10-05 MED ORDER — FLUCONAZOLE 150 MG PO TABS
150.0000 mg | ORAL_TABLET | Freq: Every day | ORAL | Status: DC
  Administered 2020-10-05 – 2020-10-06 (×2): 150 mg via ORAL
  Filled 2020-10-05 (×2): qty 1

## 2020-10-05 MED ORDER — ALPRAZOLAM 0.5 MG PO TABS
0.5000 mg | ORAL_TABLET | Freq: Every day | ORAL | Status: DC
  Administered 2020-10-05 – 2020-10-08 (×4): 0.5 mg via ORAL
  Filled 2020-10-05 (×4): qty 1

## 2020-10-05 MED ORDER — DOCUSATE SODIUM 100 MG PO CAPS: 100.0000 mg | ORAL_CAPSULE | Freq: Two times a day (BID) | ORAL | Status: DC | PRN

## 2020-10-05 MED ORDER — LORAZEPAM 2 MG/ML IJ SOLN
INTRAMUSCULAR | Status: AC
  Filled 2020-10-05: qty 1

## 2020-10-05 MED ORDER — CHLORHEXIDINE GLUCONATE CLOTH 2 % EX PADS
6.0000 | MEDICATED_PAD | Freq: Every day | CUTANEOUS | Status: DC
  Administered 2020-10-05 – 2020-10-08 (×3): 6 via TOPICAL

## 2020-10-05 MED ORDER — MAGNESIUM SULFATE 2 GM/50ML IV SOLN
2.0000 g | Freq: Once | INTRAVENOUS | Status: AC
  Administered 2020-10-05: 2 g via INTRAVENOUS
  Filled 2020-10-05: qty 50

## 2020-10-05 NOTE — ED Provider Notes (Signed)
Emergency Medicine Provider Triage Evaluation Note  Beverly Dougherty , a 60 y.o. female  was evaluated in triage.  Pt complains of confusion.  Review of Systems  Positive: Confusion, dysuria, feeling dehydrated Negative: Headache, cough, focal numbness  Physical Exam  BP (!) 141/94 (BP Location: Left Arm)   Pulse 88   Temp (!) 97.3 F (36.3 C) (Oral)   Resp 18   SpO2 100%  Gen:   Awake, no distress   Resp:  Normal effort  MSK:   Moves extremities without difficulty  Other:    Medical Decision Making  Medically screening exam initiated at 9:18 AM.  Appropriate orders placed.  Beverly Dougherty was informed that the remainder of the evaluation will be completed by another provider, this initial triage assessment does not replace that evaluation, and the importance of remaining in the ED until their evaluation is complete.  Pt who drinks heavily reportedly stop drinking on 8/13, went to [place] and was admitted for low sodium and potassium.  Report feeling "crazy and confused" since yesterday.  Also report urinary discomfort.   Domenic Moras, PA-C 10/05/20 [id number]    Truddie Hidden, MD 10/05/20 [phone]

## 2020-10-05 NOTE — ED Notes (Signed)
Unable to do neuro assessment at this time due to pt being off the floor.

## 2020-10-05 NOTE — ED Notes (Signed)
Unable to locate ED seizure pads. As a result, wrapped stretcher railing with blankets, as a seizure precaution.

## 2020-10-05 NOTE — ED Provider Notes (Signed)
[facility] EMERGENCY DEPARTMENT Provider Note  CSN: [id number] Arrival date & time: 10/05/20 [id number]    History Chief Complaint  Patient presents with   AMS    Beverly Dougherty is a 59 y.o. female with history of RA and osteoarthritis brought to the ED by EMS for evaluation of AMS. Her husband at the bedside provides much of the history. They recently went on a vacation to [place]. While on a cruise in [place], about half the passengers had a non-Covid respiratory illness. They then went to [place] for a bike riding tour but before she began riding she started feeling lightheaded, dizzy and pale. She was taken to a local hospital where she reportedly had Na 110 and low K. She was kept for several days and recommended to go to a larger facility but they were planning to come home a short time later so they left the hospital instead. She notes she was a 'closet alcoholic' but stopped drinking about 2 weeks ago during this trip. She has been drinking a lot of water. She saw her PCP yesterday, reportedly still had low Na but during the night last night began rambling, speaking non-sense and 'delusional' per husband. He reports when EMS came to get her this morning she 'had a seizure' described as stiffening of her body, eyes rolled back and foaming at the mouth. This lasted for several minutes.  Patient recognizes she is not in her usual mental status but denies any other specific complaints. Level 5 caveat.    Past Medical History:  Diagnosis Date   Insomnia    related to pain from osteoarthritis   Osteoarthritis    bilat thumbs and hips    Past Surgical History:  Procedure Laterality Date   BREAST ENHANCEMENT SURGERY     CERVICAL FUSION     HERNIA REPAIR     TONSILLECTOMY     URETHRAL FISTULA REPAIR     after childbirth   WISDOM TOOTH EXTRACTION      Family History  Problem Relation Age of Onset   Colon cancer Neg Hx     Social History   Tobacco Use   Smoking status: Former     Packs/day: 0.50    Years: 5.00    Pack years: 2.50    Types: Cigarettes    Quit date: 09/25/1984    Years since quitting: 36.0   Smokeless tobacco: Never  Substance Use Topics   Alcohol use: Yes    Comment: socially   Drug use: No     Home Medications Prior to Admission medications   Medication Sig Start Date End Date Taking? Authorizing Provider  acetaminophen (TYLENOL) 500 MG tablet Take 1,000 mg by mouth at bedtime.   Yes [provider]  Albuterol Sulfate, sensor, (PROAIR DIGIHALER) 108 (90 Base) MCG/ACT AEPB Inhale 2 puffs into the lungs every 6 (six) hours as needed (sob/wheezing).   Yes [provider]  ALPRAZolam Duanne Moron) 1 MG tablet Take 1 mg by mouth at bedtime.   Yes [provider]  amphetamine-dextroamphetamine (ADDERALL XR) 15 MG 24 hr capsule Take 15 mg by mouth every morning. 08/31/20  Yes [provider]  cetirizine (ZYRTEC) 10 MG tablet Take 10 mg by mouth daily as needed for allergies.   Yes [provider]  hydrochlorothiazide (HYDRODIURIL) 25 MG tablet Take 25 mg by mouth every morning. 08/31/20  Yes [provider]  HYDROcodone-acetaminophen (NORCO/VICODIN) 5-325 MG per tablet Take 1 tablet by mouth 2 (two) times  daily as needed for moderate pain or severe pain.   Yes [provider]  hydroxychloroquine (PLAQUENIL) 200 MG tablet Take 100-200 mg by mouth 2 (two) times daily. Take 1 tablet (200 mg) in the morning & Take 1/2 tablet (100 mg) at bedtime   Yes [provider]  meloxicam (MOBIC) 15 MG tablet Take 15 mg by mouth daily.   Yes [provider]  ondansetron (ZOFRAN-ODT) 4 MG disintegrating tablet Take 4 mg by mouth every 12 (twelve) hours as needed for vomiting or nausea. 09/10/20  Yes [provider]  progesterone (PROMETRIUM) 100 MG capsule Take 100 mg by mouth at bedtime. 09/11/20  Yes [provider]  PENNSAID 2 % SOLN APPLY 1 PUMP ('20MG'$ ) TOPICALLY TO THE AFFECTED  AREA(S) TWICE DAILY Patient not taking: No sig reported 03/14/18   Gerda Diss, DO     Allergies    Patient has no known allergies.   Review of Systems   Review of Systems Unable to assess due to mental status.    Physical Exam BP 126/81   Pulse 89   Temp (!) 97.3 F (36.3 C) (Oral)   Resp 16   Ht '5\' 4"'$  (1.626 m)   Wt 59.5 kg   SpO2 99%   BMI 22.52 kg/m   Physical Exam Vitals and nursing note reviewed.  Constitutional:      Appearance: Normal appearance.  HENT:     Head: Normocephalic and atraumatic.     Nose: Nose normal.     Mouth/Throat:     Mouth: Mucous membranes are moist.  Eyes:     Extraocular Movements: Extraocular movements intact.     Conjunctiva/sclera: Conjunctivae normal.  Cardiovascular:     Rate and Rhythm: Normal rate.  Pulmonary:     Effort: Pulmonary effort is normal.     Breath sounds: Normal breath sounds.  Abdominal:     General: Abdomen is flat.     Palpations: Abdomen is soft.     Tenderness: There is no abdominal tenderness.  Musculoskeletal:        General: No swelling. Normal range of motion.     Cervical back: Neck supple.  Skin:    General: Skin is warm and dry.  Neurological:     General: No focal deficit present.     Mental Status: She is alert.     Cranial Nerves: No cranial nerve deficit.     Motor: No weakness.  Psychiatric:     Comments: Labile mood, flight of ideas, tangential     ED Results / Procedures / Treatments   Labs (all labs ordered are listed, but only abnormal results are displayed) Labs Reviewed  COMPREHENSIVE METABOLIC PANEL - Abnormal; Notable for the following components:      Result Value   Sodium 122 (*)    Chloride 92 (*)    Glucose, Bld 110 (*)    Total Protein 6.1 (*)    All other components within normal limits  CBC - Abnormal; Notable for the following components:   RBC 3.12 (*)    Hemoglobin 10.3 (*)    HCT 29.8 (*)    Platelets 401 (*)    All other components within normal  limits  RAPID URINE DRUG SCREEN, HOSP PERFORMED - Abnormal; Notable for the following components:   Opiates POSITIVE (*)    Benzodiazepines POSITIVE (*)    All other components within normal limits  CBG MONITORING, ED - Abnormal; Notable for the following components:  Glucose-Capillary 111 (*)    All other components within normal limits  RESP PANEL BY RT-PCR (FLU A&B, COVID) ARPGX2  AMMONIA  MAGNESIUM  ETHANOL  SODIUM  SODIUM  SODIUM  SODIUM  SODIUM  SODIUM  SODIUM  HIV ANTIBODY (ROUTINE TESTING W REFLEX)  I-STAT BETA HCG BLOOD, ED ([facility], [facility], [facility] ONLY)    EKG EKG Interpretation  Date/Time:  Tuesday October 05 2020 09:05:04 EDT Ventricular Rate:  81 PR Interval:  168 QRS Duration: 78 QT Interval:  368 QTC Calculation: 427 R Axis:   -23 Text Interpretation: Normal sinus rhythm Low voltage QRS Borderline ECG No significant change since last tracing Confirmed by Calvert Cantor [phone]) on 10/05/2020 9:33:56 AM  Radiology DG Chest 2 View  Result Date: 10/05/2020 CLINICAL DATA:  Cough EXAM: CHEST - 2 VIEW COMPARISON:  CTA chest 12/23/2017 FINDINGS: The cardiomediastinal silhouette is normal. The lungs are well inflated. Patchy opacities in the left base on the frontal projection are favored to reflect subsegmental atelectasis. There is no focal consolidation or pulmonary edema. There is no pleural effusion or pneumothorax. There is S-shaped scoliosis of the spine. IMPRESSION: Left basilar subsegmental atelectasis. Otherwise, no radiographic evidence of acute cardiopulmonary process. Electronically Signed   By: Valetta Mole M.D.   On: 10/05/2020 11:19   CT Head Wo Contrast  Result Date: 10/05/2020 CLINICAL DATA:  Altered mental status EXAM: CT HEAD WITHOUT CONTRAST TECHNIQUE: Contiguous axial images were obtained from the base of the skull through the vertex without intravenous contrast. COMPARISON:  Brain MRI 12/03/2013 FINDINGS: Brain: There is no evidence of acute intracranial  hemorrhage, extra-axial fluid collection, or infarct. The ventricles are nonenlarged. There is no mass lesion. There is no midline shift. Vascular: No hyperdense vessel or unexpected calcification. Skull: Normal. Negative for fracture or focal lesion. Sinuses/Orbits: The imaged paranasal sinuses are clear. The imaged globes and orbits are unremarkable. Other: The mastoid air cells are clear. IMPRESSION: No acute intracranial pathology. Electronically Signed   By: Valetta Mole M.D.   On: 10/05/2020 11:22    Procedures .Critical Care  Date/Time: 10/05/2020 3:49 PM Performed by: Truddie Hidden, MD Authorized by: Truddie Hidden, MD   Critical care provider statement:    Critical care time (minutes):  45   Critical care time was exclusive of:  Separately billable procedures and treating other patients   Critical care was necessary to treat or prevent imminent or life-threatening deterioration of the following conditions:  Metabolic crisis   Critical care was time spent personally by me on the following activities:  Discussions with consultants, evaluation of patient's response to treatment, examination of patient, ordering and performing treatments and interventions, ordering and review of laboratory studies, ordering and review of radiographic studies, pulse oximetry, re-evaluation of patient's condition, obtaining history from patient or surrogate and review of old charts  Medications Ordered in the ED Medications  LORazepam (ATIVAN) 2 MG/ML injection (0 mg  Hold 10/05/20 1411)  docusate sodium (COLACE) capsule 100 mg (has no administration in time range)  polyethylene glycol (MIRALAX / GLYCOLAX) packet 17 g (has no administration in time range)  enoxaparin (LOVENOX) injection 40 mg (40 mg Subcutaneous Given 10/05/20 1504)  pantoprazole (PROTONIX) injection 40 mg (has no administration in time range)  LORazepam (ATIVAN) injection 2 mg (has no administration in time range)  magnesium sulfate  IVPB 2 g 50 mL (has no administration in time range)  sodium chloride 3% (hypertonic) IV bolus 100 mL (100 mLs Intravenous New Bag/Given 10/05/20  1454)     MDM Rules/Calculators/A&P MDM Patient with AMS in setting of recently stopping EtOH (although this was 2 weeks ago) and low sodium with possible seizure-like activity at home. She is awake and alert now, but rambling and hard to pin down with regards to her symptoms. Will check her labs and imaging.   ED Course  I have reviewed the triage vital signs and the nursing notes.  Pertinent labs & imaging results that were available during my care of the patient were reviewed by me and considered in my medical decision making (see chart for details).  Clinical Course as of 10/05/20 1549  Tue Oct 05, 2020  1100 CMP with moderate hyponatremia, Ammonia is normal. Mg is normal.  [CS]  1129 CT head and CXR neg for acute process.  [CS]  [id number] UDS positive for opiates and benzos, she has Rx for both in PDMP.  [CS]  1233 HCG neg. Covid is neg.  [CS]  [id number] EtOH is neg. Low sodium is likely cause of her symptoms. Given AMS and possible seizure, will discuss with ICU.  [CS]  1347 Spoke with PCCM, who will consult on the patient to determine disposition.  [CS]    Clinical Course User Index [CS] Truddie Hidden, MD    Final Clinical Impression(s) / ED Diagnoses Final diagnoses:  Altered mental status, unspecified altered mental status type  Hyponatremia  Seizure-like activity [facility])    Rx / DC Orders ED Discharge Orders     None        Truddie Hidden, MD 10/05/20 1549

## 2020-10-05 NOTE — Progress Notes (Signed)
  Radiation Oncology         [phone] ________________________________  Name: Beverly Dougherty MRN: [id number]  Date: 10/05/2020  DOB: 11-07-1960  Courtesy Note:  I was contacted by the patient's husband this morning after her seizure like activity and advised she call 911 and proceed to [facility] ED.  At her request, I reviewed this patient's most recent findings and wanted to take a minute to document my impression.  I appreciate PCCM care and wonder if the following should be considered:  UA and Urine culture to rule out UTI, MRI brain to rule out CNS etiology and thiamine banana bag.  ________________________________  Sheral Apley Tammi Klippel, M.D.

## 2020-10-05 NOTE — ED Notes (Addendum)
Pt going to US at this time ?

## 2020-10-05 NOTE — Progress Notes (Signed)
[name] Progress Note Patient Name: Beverly Dougherty DOB: 05-Dec-1960 MRN: [id number]   Date of Service  10/05/2020  HPI/Events of Note  Patient needs diet and order for home medications.  eICU Interventions  Orders entered.        Kerry Kass Loise Esguerra 10/05/2020, 9:03 PM

## 2020-10-05 NOTE — ED Notes (Signed)
This RN called to room by Dr. Heber [place] for pt having tonic clonic seizure. Dr. Heber [place] gave verbal order for 2 mg IV ativan. This RN pulled med and returned to room. Pt is awake at this time and Dr. Heber Doctor Phillips gave verbal order to hold ativan. Pt placed on 15 L NRB. Pt alert at this time but confused.

## 2020-10-05 NOTE — Progress Notes (Signed)
Pt requesting to be started back on home meds and something to eat and drink. [facility] contacted and made aware of pts requests. Pt A/O x4, answering all questions but noted to have poor attention span/concentration and quickly changing conversation topic and repeating same statements/questions.   Pts husband took patients home meds and clothing home. 1 black colored set of glasses and 1 cell phone with charger left at bedside.

## 2020-10-05 NOTE — Procedures (Addendum)
Patient Name: Beverly Dougherty  MRN: [id number]  Epilepsy Attending: Lora Havens  Referring Physician/Provider: Awanda Mink, NP Date: 10/05/2020 Duration: 22.57 mins  Patient history: 60 y/o female presented today in the setting of confusion and hyponatremia.  She had seizure this morning.  EEG to evaluate for seizure.  Level of alertness: Awake  AEDs during EEG study: None  Technical aspects: This EEG study was done with scalp electrodes positioned according to the 10-20 International system of electrode placement. Electrical activity was acquired at a sampling rate of '500Hz'$  and reviewed with a high frequency filter of '70Hz'$  and a low frequency filter of '1Hz'$ . EEG data were recorded continuously and digitally stored.   Description: The posterior dominant rhythm consists of 9.5 Hz activity of moderate voltage (25-35 uV) seen predominantly in posterior head regions, symmetric and reactive to eye opening and eye closing. Hyperventilation and photic stimulation were not performed.     IMPRESSION: This study is within normal limits. No seizures or epileptiform discharges were seen throughout the recording.  Fabienne Nolasco Barbra Sarks

## 2020-10-05 NOTE — H&P (Addendum)
NAME:  Beverly Dougherty, MRN:  [id number], DOB:  24-Jun-1960, LOS: 0 ADMISSION DATE:  10/05/2020, CONSULTATION DATE: 8/30 REFERRING MD: Dr. Karle Starch EDP, CHIEF COMPLAINT: Hyponatremia  History of Present Illness:  60 year old female with past medical history as below, which is significant for rheumatoid arthritis, hypertension, and alcohol use.  According to her husband she would use approximately 1 bottle of wine per day.  She recently stopped drinking in preparation for a trip to [place].  Last sip of alcohol reportedly August 15.  While on her trip to [place] the patient developed altered mental status with "strokelike symptoms" which prompted she and her husband who presented to the hospital in [place].  Her sodium at that time was found to be 110.  They were inpatient longer for the sodium to be corrected to 118.  The hospital is attempting to transfer to a higher level of care, however, the patient and her husband made the decision to discharge from the hospital with plans to return to [place] and seek further medical attention.    She had labs done through her primary care office on 8/29 which demonstrated a sodium 122.  On 8/30 her symptoms of confusion and delirium became worse prompting EMS call.  The patient reportedly experienced seizure-like activity upon EMS arrival and she was transported to [facility] emergency department.  Laboratory evaluation in the emergency department significant for sodium of 122.  CT imaging of the head was negative.  Considering hyponatremia with altered mental status PCCM was asked to admit the patient to the hospital.  Upon my arrival to the emergency department room the patient had a witnessed 30 second generalized tonic-clonic seizure, which resolved spontaneously before Ativan could be given.  She quickly returned to her pre-seizure baseline.   Pertinent  Medical History   has a past medical history of Insomnia and Osteoarthritis.   Significant Hospital  Events: Including procedures, antibiotic start and stop dates in addition to other pertinent events     Interim History / Subjective:    Objective   Blood pressure 131/77, pulse 96, temperature (!) 97.3 F (36.3 C), temperature source Oral, resp. rate 17, height '5\' 4"'$  (1.626 m), weight 59.5 kg, SpO2 98 %.       No intake or output data in the 24 hours ending 10/05/20 1454 Filed Weights   10/05/20 0947  Weight: 59.5 kg    Examination: General: Middle-aged female with normal body habitus HENT: Normocephalic, atraumatic, PERRL.  Mucous membranes dry Lungs: Clear bilateral breath sounds Cardiovascular: Regular rate and rhythm no murmurs Abdomen: Soft, nontender, nondistended Extremities: No acute deformities or range of motion limitations.  Ecchymosis versus mottled appearance to bilateral elbows and bilateral knees Neuro: Awake, alert, delirium.  Able to communicate somewhat effectively, but babbling constantly and perseverating on certain sentences.  Resolved Hospital Problem list     Assessment & Plan:   Hyponatremia: Seems hypovolemic in nature.  She does have a history of alcohol use, however she would not routinely drink beer.  She did have a GI illness while abroad resulting in significant nausea and vomiting.  Of note, husband reports she has been drinking copious amounts of water recently without much food intake. On HCTZ at home. Unclear if she has been taking.  - 3% saline 145m bolus due to seizure - Will repeat sodium 2 hours after bolus to determine further correction needs. - Sodium check every 2 hours. - Hold HCTZ  Seizure: almost certainly due to hyponatremia, but  odd that seizure occurred now that sodium is 122 rather than when it was 110 a couple of weeks ago.  - Correct sodium as above with hypertonic bolus. - EEG - If she should seize again we will consult neurology and seek MRI.   Hypertension: - hold home HCTZ  Acute metabolic encephalopathy vs DT's.  DTs less likely as patient reports last drop of alcohol was 8/15. Likely hyponatremia related.  - Hold home alprazolam and Adderall  Candidal vulvovaginitis: diagnosed at GYN clinic 8/29, but has not taken her prescribed medication.  - Fluconazole '150mg'$  PO x 1  RA: - hold home hydroxychloroquine and meloxicam until able to consistently take PO.   Hypomagnesemia - Give mag 2g  Alcohol abuse - thiamine, folic acid   Best Practice (right click and "Reselect all SmartList Selections" daily)   Diet/type: NPO except sips with meds.  DVT prophylaxis: LMWH GI prophylaxis: PPI Lines: N/A Foley:  N/A Code Status:  full code Last date of multidisciplinary goals of care discussion '[]'$   Labs   CBC: Recent Labs  Lab 10/05/20 0930  WBC 4.8  HGB 10.3*  HCT 29.8*  MCV 95.5  PLT 401*    Basic Metabolic Panel: Recent Labs  Lab 10/05/20 0930  NA 122*  K 4.1  CL 92*  CO2 22  GLUCOSE 110*  BUN 6  CREATININE 0.81  CALCIUM 8.9  MG 1.8   GFR: Estimated Creatinine Clearance: 65.4 mL/min (by C-G formula based on SCr of 0.81 mg/dL). Recent Labs  Lab 10/05/20 0930  WBC 4.8    Liver Function Tests: Recent Labs  Lab 10/05/20 0930  AST 31  ALT 31  ALKPHOS 55  BILITOT 0.8  PROT 6.1*  ALBUMIN 3.6   No results for input(s): LIPASE, AMYLASE in the last 168 hours. Recent Labs  Lab 10/05/20 0930  AMMONIA 12    ABG No results found for: PHART, PCO2ART, PO2ART, HCO3, TCO2, ACIDBASEDEF, O2SAT   Coagulation Profile: No results for input(s): INR, PROTIME in the last 168 hours.  Cardiac Enzymes: No results for input(s): CKTOTAL, CKMB, CKMBINDEX, TROPONINI in the last 168 hours.  HbA1C: No results found for: HGBA1C  CBG: Recent Labs  Lab 10/05/20 1010  GLUCAP 111*    Review of Systems:     Past Medical History:  She,  has a past medical history of Insomnia and Osteoarthritis.   Surgical History:   Past Surgical History:  Procedure Laterality Date   BREAST  ENHANCEMENT SURGERY     CERVICAL FUSION     HERNIA REPAIR     TONSILLECTOMY     URETHRAL FISTULA REPAIR     after childbirth   WISDOM TOOTH EXTRACTION       Social History:   reports that she quit smoking about 36 years ago. Her smoking use included cigarettes. She has a 2.50 pack-year smoking history. She has never used smokeless tobacco. She reports current alcohol use. She reports that she does not use drugs.   Family History:  Her family history is negative for Colon cancer.   Allergies No Known Allergies   Home Medications  Prior to Admission medications   Medication Sig Start Date End Date Taking? Authorizing Provider  acetaminophen (TYLENOL) 500 MG tablet Take 1,000 mg by mouth at bedtime.   Yes [provider]  Albuterol Sulfate, sensor, (PROAIR DIGIHALER) 108 (90 Base) MCG/ACT AEPB Inhale 2 puffs into the lungs every 6 (six) hours as needed (sob/wheezing).   Yes [provider]  ALPRAZolam (XANAX) 1 MG tablet Take 1 mg by mouth at bedtime.   Yes [provider]  amphetamine-dextroamphetamine (ADDERALL XR) 15 MG 24 hr capsule Take 15 mg by mouth every morning. 08/31/20  Yes [provider]  cetirizine (ZYRTEC) 10 MG tablet Take 10 mg by mouth daily as needed for allergies.   Yes [provider]  hydrochlorothiazide (HYDRODIURIL) 25 MG tablet Take 25 mg by mouth every morning. 08/31/20  Yes [provider]  HYDROcodone-acetaminophen (NORCO/VICODIN) 5-325 MG per tablet Take 1 tablet by mouth 2 (two) times daily as needed for moderate pain or severe pain.   Yes [provider]  hydroxychloroquine (PLAQUENIL) 200 MG tablet Take 100-200 mg by mouth 2 (two) times daily. Take 1 tablet (200 mg) in the morning & Take 1/2 tablet (100 mg) at bedtime   Yes [provider]  meloxicam (MOBIC) 15 MG tablet Take 15 mg by mouth daily.   Yes [provider]  ondansetron (ZOFRAN-ODT) 4 MG disintegrating tablet Take 4 mg  by mouth every 12 (twelve) hours as needed for vomiting or nausea. 09/10/20  Yes [provider]  progesterone (PROMETRIUM) 100 MG capsule Take 100 mg by mouth at bedtime. 09/11/20  Yes [provider]  PENNSAID 2 % SOLN APPLY 1 PUMP ('20MG'$ ) TOPICALLY TO THE AFFECTED AREA(S) TWICE DAILY Patient not taking: No sig reported 03/14/18   Gerda Diss, DO     Critical care time: 48 minutes     Georgann Housekeeper, AGACNP-BC [place]  See Amion for personal pager PCCM on call pager [phone] until 7pm. Please call Elink 7p-7a. [phone]  10/05/2020 3:50 PM

## 2020-10-05 NOTE — Progress Notes (Signed)
EEG Completed; Results Pending  

## 2020-10-05 NOTE — ED Triage Notes (Signed)
Pt here via EMS with c/o of AMS with onset of 8/29 Pt recently in [place], hospitalized there. Reports of low K+ and low Na+. Pt confused. EMS reports seizure of about 30 secs and urinated on self.  Pt quit drinking 13 days ago.

## 2020-10-05 NOTE — ED Notes (Signed)
Pt back from [place] at this time.

## 2020-10-06 ENCOUNTER — Inpatient Hospital Stay (HOSPITAL_COMMUNITY): Payer: No Typology Code available for payment source

## 2020-10-06 DIAGNOSIS — E871 Hypo-osmolality and hyponatremia: Secondary | ICD-10-CM | POA: Diagnosis not present

## 2020-10-06 LAB — BASIC METABOLIC PANEL
Anion gap: 11 (ref 5–15)
Anion gap: 12 (ref 5–15)
BUN: 5 mg/dL — ABNORMAL LOW (ref 6–20)
BUN: 5 mg/dL — ABNORMAL LOW (ref 6–20)
CO2: 19 mmol/L — ABNORMAL LOW (ref 22–32)
CO2: 20 mmol/L — ABNORMAL LOW (ref 22–32)
Calcium: 8.6 mg/dL — ABNORMAL LOW (ref 8.9–10.3)
Calcium: 8.5 mg/dL — ABNORMAL LOW (ref 8.9–10.3)
Chloride: 97 mmol/L — ABNORMAL LOW (ref 98–111)
Chloride: 96 mmol/L — ABNORMAL LOW (ref 98–111)
Creatinine, Ser: 0.76 mg/dL (ref 0.44–1.00)
Creatinine, Ser: 1.09 mg/dL — ABNORMAL HIGH (ref 0.44–1.00)
GFR, Estimated: >60 mL/min (ref >60)
GFR, Estimated: 59 mL/min — ABNORMAL LOW (ref >60)
Glucose, Bld: 82 mg/dL (ref 70–99)
Glucose, Bld: 145 mg/dL — ABNORMAL HIGH (ref 70–99)
Potassium: 3.9 mmol/L (ref 3.5–5.1)
Potassium: 3.5 mmol/L (ref 3.5–5.1)
Sodium: 127 mmol/L — ABNORMAL LOW (ref 135–145)
Sodium: 128 mmol/L — ABNORMAL LOW (ref 135–145)

## 2020-10-06 LAB — CORTISOL: Cortisol, Plasma: 11.6 ug/dL

## 2020-10-06 LAB — CBC
HCT: 31.6 % — ABNORMAL LOW (ref 36.0–46.0)
Hemoglobin: 10.7 g/dL — ABNORMAL LOW (ref 12.0–15.0)
MCH: 32.7 pg (ref 26.0–34.0)
MCHC: 33.9 g/dL (ref 30.0–36.0)
MCV: 96.6 fL (ref 80.0–100.0)
Platelets: 419 10*3/uL — ABNORMAL HIGH (ref 150–400)
RBC: 3.27 MIL/uL — ABNORMAL LOW (ref 3.87–5.11)
RDW: 11.9 % (ref 11.5–15.5)
WBC: 6.3 10*3/uL (ref 4.0–10.5)
nRBC: 0 % (ref 0.0–0.2)

## 2020-10-06 LAB — PHOSPHORUS: Phosphorus: 3.8 mg/dL (ref 2.5–4.6)

## 2020-10-06 LAB — SODIUM
Sodium: 127 mmol/L — ABNORMAL LOW (ref 135–145)
Sodium: 128 mmol/L — ABNORMAL LOW (ref 135–145)
Sodium: 129 mmol/L — ABNORMAL LOW (ref 135–145)

## 2020-10-06 LAB — MAGNESIUM: Magnesium: 2.3 mg/dL (ref 1.7–2.4)

## 2020-10-06 LAB — GLUCOSE, CAPILLARY
Glucose-Capillary: 112 mg/dL — ABNORMAL HIGH (ref 70–99)
Glucose-Capillary: 132 mg/dL — ABNORMAL HIGH (ref 70–99)

## 2020-10-06 MED ORDER — THIAMINE HCL 100 MG/ML IJ SOLN
250.0000 mg | Freq: Every day | INTRAVENOUS | Status: DC
  Administered 2020-10-09: 250 mg via INTRAVENOUS
  Filled 2020-10-06: qty 2.5

## 2020-10-06 MED ORDER — POTASSIUM CHLORIDE CRYS ER 20 MEQ PO TBCR
40.0000 meq | EXTENDED_RELEASE_TABLET | Freq: Once | ORAL | Status: AC
  Administered 2020-10-06: 40 meq via ORAL
  Filled 2020-10-06: qty 2

## 2020-10-06 MED ORDER — THIAMINE HCL 100 MG PO TABS: 100.0000 mg | ORAL_TABLET | Freq: Every day | ORAL | Status: DC

## 2020-10-06 MED ORDER — ACETAMINOPHEN 325 MG PO TABS
650.0000 mg | ORAL_TABLET | Freq: Four times a day (QID) | ORAL | Status: DC | PRN
  Administered 2020-10-06 – 2020-10-09 (×5): 650 mg via ORAL
  Filled 2020-10-06 (×5): qty 2

## 2020-10-06 MED ORDER — THIAMINE HCL 100 MG/ML IJ SOLN: 100.0000 mg | Freq: Every day | INTRAMUSCULAR | Status: DC

## 2020-10-06 MED ORDER — FOLIC ACID 1 MG PO TABS
1.0000 mg | ORAL_TABLET | Freq: Every day | ORAL | Status: DC
  Administered 2020-10-06 – 2020-10-09 (×4): 1 mg via ORAL
  Filled 2020-10-06 (×5): qty 1

## 2020-10-06 MED ORDER — SODIUM CHLORIDE 0.9 % IV SOLN: INTRAVENOUS | Status: DC

## 2020-10-06 MED ORDER — GADOBUTROL 1 MMOL/ML IV SOLN
5.5000 mL | Freq: Once | INTRAVENOUS | Status: AC | PRN
  Administered 2020-10-06: 5.5 mL via INTRAVENOUS

## 2020-10-06 MED ORDER — ADULT MULTIVITAMIN W/MINERALS CH
1.0000 | ORAL_TABLET | Freq: Every day | ORAL | Status: DC
  Administered 2020-10-06 – 2020-10-09 (×4): 1 via ORAL
  Filled 2020-10-06 (×4): qty 1

## 2020-10-06 MED ORDER — MELOXICAM 7.5 MG PO TABS
15.0000 mg | ORAL_TABLET | Freq: Every day | ORAL | Status: AC
  Administered 2020-10-06 – 2020-10-09 (×4): 15 mg via ORAL
  Filled 2020-10-06 (×5): qty 2

## 2020-10-06 MED ORDER — THIAMINE HCL 100 MG/ML IJ SOLN
500.0000 mg | Freq: Three times a day (TID) | INTRAVENOUS | Status: AC
  Administered 2020-10-06 – 2020-10-08 (×6): 500 mg via INTRAVENOUS
  Filled 2020-10-06 (×9): qty 5

## 2020-10-06 NOTE — Progress Notes (Signed)
NAME:  Beverly Dougherty, MRN:  [id number], DOB:  1961/01/04, LOS: 1 ADMISSION DATE:  10/05/2020, CONSULTATION DATE: 8/30 REFERRING MD: Dr. Karle Starch EDP, CHIEF COMPLAINT: Hyponatremia  History of Present Illness:  60 year old female with past medical history as below, which is significant for rheumatoid arthritis, hypertension, and alcohol use.  According to her husband she would use approximately 1 bottle of wine per day.  She recently stopped drinking in preparation for a trip to [place].  Last sip of alcohol reportedly August 15.  While on her trip to [place] the patient developed altered mental status with "strokelike symptoms" which prompted she and her husband who presented to the hospital in [place].  Her sodium at that time was found to be 110.  They were inpatient longer for the sodium to be corrected to 118.  The hospital is attempting to transfer to a higher level of care, however, the patient and her husband made the decision to discharge from the hospital with plans to return to [place] and seek further medical attention.    She had labs done through her primary care office on 8/29 which demonstrated a sodium 122.  On 8/30 her symptoms of confusion and delirium became worse prompting EMS call.  The patient reportedly experienced seizure-like activity upon EMS arrival and she was transported to [facility] emergency department.  Laboratory evaluation in the emergency department significant for sodium of 122.  CT imaging of the head was negative.  Considering hyponatremia with altered mental status PCCM was asked to admit the patient to the hospital.  Upon my arrival to the emergency department room the patient had a witnessed 30 second generalized tonic-clonic seizure, which resolved spontaneously before Ativan could be given.  She quickly returned to her pre-seizure baseline.   Pertinent  Medical History   has a past medical history of Insomnia and Osteoarthritis.   Significant Hospital  Events: Including procedures, antibiotic start and stop dates in addition to other pertinent events   8/30: Admitted, given bolus 3% NaCL for seizure 8/31 Started NS infusion  Interim History / Subjective:    Objective   Blood pressure 109/70, pulse 84, temperature 97.7 F (36.5 C), temperature source Oral, resp. rate 16, height '5\' 4"'$  (1.626 m), weight 55.8 kg, SpO2 97 %.        Intake/Output Summary (Last 24 hours) at 10/06/2020 0743 Last data filed at 10/06/2020 0600 Gross per 24 hour  Intake 630 ml  Output 400 ml  Net 230 ml   Filed Weights   10/05/20 0947 10/06/20 0500  Weight: 59.5 kg 55.8 kg    Examination: Physical Exam Constitutional:      General: She is not in acute distress.    Appearance: Normal appearance.     Comments: Answers questions with sluggish responses, NAD  HENT:     Head: Normocephalic and atraumatic.  Eyes:     General:        Right eye: No discharge.        Left eye: No discharge.     Conjunctiva/sclera: Conjunctivae normal.     Pupils: Pupils are equal, round, and reactive to light.  Cardiovascular:     Rate and Rhythm: Normal rate and regular rhythm.     Pulses: Normal pulses.     Heart sounds: Normal heart sounds. No murmur heard.   No friction rub. No gallop.  Pulmonary:     Effort: Pulmonary effort is normal.     Breath sounds: Normal breath sounds.  No wheezing, rhonchi or rales.  Abdominal:     General: Abdomen is flat.     Palpations: Abdomen is soft.     Tenderness: There is no abdominal tenderness.  Neurological:     Mental Status: She is alert.     Comments: Moving all extremities spontaneously      Resolved Hospital Problem list   Candidal vulvovaginitis Hypomagnesemia  Assessment & Plan:   Hyponatremia:  Seems hypovolemic in nature.  She did have a GI illness while abroad resulting in significant nausea and vomiting.  Has had increased intake of water to offset her diarrhea, but has had limited PO uptake.  HCTZ held  at admission. Had a seizure on 10/05/20 in the ED requiring 3% Saline, which her Na responded well to administration. Her Na this AM is 129.  - Start NS infusion - Encourage PO intake - Trend BMPs BID - Will repeat sodium 2 hours after bolus to determine further correction needs. - Sodium check every 2 hours. - Hold HCTZ - Repeat 0800 Cortisol level  Seizure:  Patient endorses having 3 seizure events overnight, one while speaking with her daughter on the phone, but these were neither witnessed nor documented. These seizures last several seconds in duration.  Patient states that she has had multiple falls in the past. Initial CT head negative.  In addition to her hyponatremia, patient was also given fluconazole, which can induce seizures. Will reach out to neurology for further evaluation.  - Appreciate Neurology's recommendations - MRI Brain w/wo Contrast - Correct sodium as above with NS and increased PO intake.  Hypertension: - hold home HCTZ  Acute metabolic encephalopathy vs DT's.  Mentation has improved with correcting of her Na.  - Hold home alprazolam and Adderall  Candidal vulvovaginitis:  - Completed Fluconazole course  RA: - Continue Hydroxychloroquine 200 mg in AM and 100 mg QHS - Start tylenol - Continue Mobic  Alcohol abuse - thiamine, folic acid, multivitamin  Best Practice (right click and "Reselect all SmartList Selections" daily)   Diet/type: Regular  DVT prophylaxis: LMWH GI prophylaxis: PPI Lines: N/A Foley:  N/A Code Status:  full code Last date of multidisciplinary goals of care discussion '[]'$   Labs   CBC: Recent Labs  Lab 10/05/20 0930 10/06/20 0408  WBC 4.8 6.3  HGB 10.3* 10.7*  HCT 29.8* 31.6*  MCV 95.5 96.6  PLT 401* 419*    Basic Metabolic Panel: Recent Labs  Lab 10/05/20 0930 10/05/20 1811 10/05/20 2048 10/05/20 2313 10/06/20 0056 10/06/20 0408  NA 122* 126* 127* 127* 128* 127*  K 4.1  --   --   --   --  3.9  CL 92*  --   --    --   --  97*  CO2 22  --   --   --   --  19*  GLUCOSE 110*  --   --   --   --  82  BUN 6  --   --   --   --  5*  CREATININE 0.81  --   --   --   --  0.76  CALCIUM 8.9  --   --   --   --  8.6*  MG 1.8  --   --   --   --  2.3  PHOS  --   --   --   --   --  3.8   GFR: Estimated Creatinine Clearance: 66.2 mL/min (by C-G formula based  on SCr of 0.76 mg/dL). Recent Labs  Lab 10/05/20 0930 10/06/20 0408  WBC 4.8 6.3    Liver Function Tests: Recent Labs  Lab 10/05/20 0930  AST 31  ALT 31  ALKPHOS 55  BILITOT 0.8  PROT 6.1*  ALBUMIN 3.6   No results for input(s): LIPASE, AMYLASE in the last 168 hours. Recent Labs  Lab 10/05/20 0930  AMMONIA 12    ABG No results found for: PHART, PCO2ART, PO2ART, HCO3, TCO2, ACIDBASEDEF, O2SAT   Coagulation Profile: No results for input(s): INR, PROTIME in the last 168 hours.  Cardiac Enzymes: No results for input(s): CKTOTAL, CKMB, CKMBINDEX, TROPONINI in the last 168 hours.  HbA1C: No results found for: HGBA1C  CBG: Recent Labs  Lab 10/05/20 1010  GLUCAP 111*    Review of Systems:     Past Medical History:  She,  has a past medical history of Insomnia and Osteoarthritis.   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 Her smoking use included cigarettes. She has a 2.50 pack-year smoking history. She has never used smokeless tobacco. She reports current alcohol use. She reports that she does not use drugs.   Family History:  Her family history is negative for Colon cancer.   Allergies No Known Allergies   Home Medications  Prior to Admission medications   Medication Sig Start Date End Date Taking? Authorizing Provider  acetaminophen (TYLENOL) 500 MG  tablet Take 1,000 mg by mouth at bedtime.   Yes [provider]  Albuterol Sulfate, sensor, (PROAIR DIGIHALER) 108 (90 Base) MCG/ACT AEPB Inhale 2 puffs into the lungs every 6 (six) hours as needed (sob/wheezing).   Yes [provider]  ALPRAZolam Duanne Moron) 1 MG tablet Take 1 mg by mouth at bedtime.   Yes [provider]  amphetamine-dextroamphetamine (ADDERALL XR) 15 MG 24 hr capsule Take 15 mg by mouth every morning. 08/31/20  Yes [provider]  cetirizine (ZYRTEC) 10 MG tablet Take 10 mg by mouth daily as needed for allergies.   Yes [provider]  hydrochlorothiazide (HYDRODIURIL) 25 MG tablet Take 25 mg by mouth every morning. 08/31/20  Yes [provider]  HYDROcodone-acetaminophen (NORCO/VICODIN) 5-325 MG per tablet Take 1 tablet by mouth 2 (two) times daily as needed for moderate pain or severe pain.   Yes [provider]  hydroxychloroquine (PLAQUENIL) 200 MG tablet Take 100-200 mg by mouth 2 (two) times daily. Take 1 tablet (200 mg) in the morning & Take 1/2 tablet (100 mg) at bedtime   Yes [provider]  meloxicam (MOBIC) 15 MG tablet Take 15 mg by mouth daily.   Yes [provider]  ondansetron (ZOFRAN-ODT) 4 MG disintegrating tablet Take 4 mg by mouth every 12 (twelve) hours as needed for vomiting or nausea. 09/10/20  Yes [provider]  progesterone (PROMETRIUM) 100 MG capsule Take 100 mg by mouth at bedtime. 09/11/20  Yes [provider]  PENNSAID 2 % SOLN APPLY 1 PUMP ('20MG'$ ) TOPICALLY TO THE AFFECTED AREA(S) TWICE DAILY Patient not taking: No sig reported 03/14/18   Gerda Diss, DO     Maudie Mercury, MD  [facility], PGY-3 Pager: [phone] 10/06/2020,7:43 AM   See Shea Evans for personal pager PCCM on call pager [phone] until 7pm. Please call Elink 7p-7a. [phone]  10/06/2020 7:43 AM

## 2020-10-06 NOTE — Progress Notes (Signed)
LTM EEG setup at bedside. No skin breakdown noted at hookup. [facility] monitoring called. Results pending.

## 2020-10-06 NOTE — Progress Notes (Signed)
Attending:    Subjective: Admitted yesterday for seizure in setting of hyponatremia Received hypertonic saline yesterday 100cc bolus Na 118> 129 this morning No acute events overnight  Objective: Vitals:   10/06/20 0400 10/06/20 0500 10/06/20 0600 10/06/20 0700  BP: 135/90 135/90 109/70   Pulse: 90 86 84   Resp: '15 18 16   '$ Temp: 97.7 F (36.5 C)   97.9 F (36.6 C)  TempSrc: Oral   Oral  SpO2: 99% 99% 97%   Weight:  55.8 kg    Height:          Intake/Output Summary (Last 24 hours) at 10/06/2020 1050 Last data filed at 10/06/2020 1000 Gross per 24 hour  Intake 646 ml  Output 401 ml  Net 245 ml    General:  Resting comfortably in bed HENT: NCAT OP clear PULM: CTA B, normal effort CV: RRR, no mgr GI: BS+, soft, nontender MSK: normal bulk and tone Neuro: awake, alert, no distress, MAEW   RUQ ultrasound > hepatic steatosis  CBC    Component Value Date/Time   WBC 6.3 10/06/2020 0408   RBC 3.27 (L) 10/06/2020 0408   HGB 10.7 (L) 10/06/2020 0408   HCT 31.6 (L) 10/06/2020 0408   PLT 419 (H) 10/06/2020 0408   MCV 96.6 10/06/2020 0408   MCH 32.7 10/06/2020 0408   MCHC 33.9 10/06/2020 0408   RDW 11.9 10/06/2020 0408    BMET    Component Value Date/Time   NA 129 (L) 10/06/2020 0800   K 3.9 10/06/2020 0408   CL 97 (L) 10/06/2020 0408   CO2 19 (L) 10/06/2020 0408   GLUCOSE 82 10/06/2020 0408   BUN 5 (L) 10/06/2020 0408   CREATININE 0.76 10/06/2020 0408   CALCIUM 8.6 (L) 10/06/2020 0408   GFRNONAA >60 10/06/2020 0408   GFRAA >60 12/23/2017 1211   EEG negative    Impression/Plan: Hypovolemic hyponatremia> start saline infusion today, encourage PO, not sure what to make of the PM cortisol result, will repeat that again in AM, monitor BMET twice a day, continue saline today;  hold hydrochlorothiazide  RA> continue hydroxychloroquine; unfortunately it's too late to try to draw urine/serum osmolarity;   Seizure, related to hyponatremia> resolved.  She reports  to [place] this morning had three episodes overnight where she suddenly lost the ability to speak.  It's not clear to me what happened there, will consult neurology to consider further.   Acute metabolic encephalopathy> resolved  To [facility]  My cc time n/a minutes   Roselie Awkward, MD [place] PCCM Pager: [phone] Cell: [phone] After 7pm: [phone]

## 2020-10-06 NOTE — Progress Notes (Signed)
[facility] ADULT ICU REPLACEMENT PROTOCOL   The patient does apply for the [facility] Adult ICU Electrolyte Replacment Protocol based on the criteria listed below:   1.Exclusion criteria: TCTS patients, ECMO patients and Hypothermia Protocol, and   Dialysis patients 2. Is GFR >/= 30 ml/min? Yes.    Patient's GFR today is 59 3. Is SCr </= 2? No. Patient's SCr is 1.09 mg/dL 4. Did SCr increase >/= 0.5 in 24 hours? No. 5.Pt's weight >40kg  Yes.   6. Abnormal electrolyte(s): K+ 3.5  7. Electrolytes replaced per protocol 8.  Call MD STAT for K+ </= 2.5, Phos </= 1, or Mag </= 1 Physician:  n/a   Beverly Dougherty 10/06/2020 8:09 PM

## 2020-10-06 NOTE — Progress Notes (Signed)
[place] Progress Note Patient Name: Beverly Dougherty DOB: [birth year] MRN: [id number]   Date of Service  10/06/2020  HPI/Events of Note  Serum sodium 127-128 over the last 4 Q 2 hourly measurements.  eICU Interventions  Serum sodium changed to Q 4 hourly.        Frederik Pear 10/06/2020, 6:46 AM

## 2020-10-06 NOTE — Consult Note (Signed)
NEUROLOGY CONSULTATION NOTE   Date of service: October 06, 2020 Patient Name: Beverly Dougherty MRN:  [id number] DOB:  [birth year] Reason for consult: "Seizures in the setting of hyponatremia, episodes of speech abnormality" Requesting Provider: Juanito Doom, MD _ _ _   _ __   _ __ _ _  __ __   _ __   __ _  History of Present Beverly Dougherty is a 60 y.o. female with PMH significant for EtOH use of about 1 bottle of wine per day, Rheumatoid arthritis, hypertension who presented with seizures.  Has been drinking to cope with her brother's death 5 years ago. Recently quit EtOH prior to her trip to [place]. Last drink was on august 15th. While in [place], had episode with vision going white and requiring assistance with walking and confusion. She was taken to a local hospital. Was noted to have a sodium of 110. Patient was not happy with her car eat the hospital so she left the hospital and returned to [place].  She was her PCP and labs at her PCP office with a Sodium of 122. On 8/30, noted to have worsening confusion and so they called EMS. Had seizure upon EMS arrival and brought in to [facility] ED. Had another seizure in the ED. Sodium here down to 118. CTH was negative. She was admitted to the ICU for hyponatremia. Sodium corrected in the ICU to 129 this AM.  Patient reports multiple episodes of getting stuck on word here and there and we were asked to assess her for possible seizures.  MRI Brain w + w/o contrast was obtained and was negative for any acute intracranial abnormalities. She is hooked up to cEEG.  Endorses significant head injury with LOC in 2018 while vacationing, no hx of CNS surgery, no hx of CNS infection, no hx of strokes, no hx of ICH.    ROS   Constitutional Denies weight loss, fever and chills.   HEENT Denies changes in vision and hearing.   Respiratory Denies SOB and cough.   CV Denies palpitations and CP   GI Denies abdominal pain, nausea, vomiting and  diarrhea.   GU Denies dysuria and urinary frequency.   MSK Denies myalgia and joint pain.   Skin Denies rash and pruritus.   Neurological Denies headache and syncope.   Psychiatric Denies recent changes in mood. Denies anxiety and depression.    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   WISDOM TOOTH EXTRACTION     Family History  Problem Relation Age of Onset   Colon cancer Neg Hx    Social History   Socioeconomic History   Marital status: Married    Spouse name: Not on file   Number of children: Not on file   Years of education: Not on file   Highest education level: Not on file  Occupational History   Not on file  Tobacco Use   Smoking status: Former    Packs/day: 0.50    Years: 5.00    Pack years: 2.50    Types: Cigarettes  Quit date: 09/25/1984    Years since quitting: 36.0   Smokeless tobacco: Never  Substance and Sexual Activity   Alcohol use: Yes    Comment: socially   Drug use: No   Sexual activity: Not on file  Other Topics Concern   Not on file  Social History Narrative   Not on file   Social Determinants of Health   Financial Resource Strain: Not on file  Food Insecurity: Not on file  Transportation Needs: Not on file  Physical Activity: Not on file  Stress: Not on file  Social Connections: Not on file   No Known Allergies  Medications   Medications Prior to Admission  Medication Sig Dispense Refill Last Dose   acetaminophen (TYLENOL) 500 MG tablet Take 1,000 mg by mouth at bedtime.   10/04/2020   Albuterol Sulfate, sensor, (PROAIR DIGIHALER) 108 (90 Base) MCG/ACT AEPB Inhale 2 puffs into the lungs every 6 (six) hours as needed (sob/wheezing).    unknown   ALPRAZolam (XANAX) 1 MG tablet Take 1 mg by mouth at bedtime.   Past Week   amphetamine-dextroamphetamine (ADDERALL XR) 15 MG 24 hr capsule Take 15 mg by mouth every morning.   10/04/2020   cetirizine (ZYRTEC) 10 MG tablet Take 10 mg by mouth daily as needed for allergies.   unknown   hydrochlorothiazide (HYDRODIURIL) 25 MG tablet Take 25 mg by mouth every morning.   Past Week   HYDROcodone-acetaminophen (NORCO/VICODIN) 5-325 MG per tablet Take 1 tablet by mouth 2 (two) times daily as needed for moderate pain or severe pain.   10/04/2020   hydroxychloroquine (PLAQUENIL) 200 MG tablet Take 100-200 mg by mouth 2 (two) times daily. Take 1 tablet (200 mg) in the morning & Take 1/2 tablet (100 mg) at bedtime   10/04/2020   meloxicam (MOBIC) 15 MG tablet Take 15 mg by mouth daily.   10/04/2020   ondansetron (ZOFRAN-ODT) 4 MG disintegrating tablet Take 4 mg by mouth every 12 (twelve) hours as needed for vomiting or nausea.   Past Week   progesterone (PROMETRIUM) 100 MG capsule Take 100 mg by mouth at bedtime.   Past Week   PENNSAID 2 % SOLN APPLY 1 PUMP ('20MG'$ ) TOPICALLY TO THE AFFECTED AREA(S) TWICE DAILY (Patient not taking: No sig reported) 112 g 1 Completed Course     Vitals   Vitals:   10/06/20 1200 10/06/20 1300 10/06/20 1954 10/06/20 2000  BP: 105/74 110/89  101/61  Pulse: 76 83  88  Resp: (!) 26 (!) 21  (!) 24  Temp:   98.2 F (36.8 C)   TempSrc:   Oral   SpO2: 96% 96%  98%  Weight:      Height:         Body mass index is 21.12 kg/m.  Physical Exam   General: Laying comfortably in bed; in no acute distress.  HENT: Normal oropharynx and mucosa. Normal external appearance of ears and nose.  Neck: Supple, no pain or tenderness  CV: No JVD. No peripheral edema.  Pulmonary: Symmetric Chest rise. Normal respiratory effort.  Abdomen: Soft to touch, non-tender.  Ext: No cyanosis, edema, or deformity  Skin: No rash. Normal palpation of skin.   Musculoskeletal: Normal digits and  nails by inspection. No clubbing.   Neurologic Examination  Mental status/Cognition: Alert, oriented to self, place, month and year, good attention. 3 out of 3 recall immediately, 1 out of 3 recall at 5 mins. Circumferential thought process, tends to veer off from  the original question and then loses her train of thought. Maybe confabulation? Eventually answers the question when redirected. Speech/language: Fluent, comprehension intact, object naming intact, repetition intact.  Cranial nerves:   CN II Pupils equal and reactive to light, no VF deficits    CN III,IV,VI EOM intact, no gaze preference or deviation, no nystagmus    CN V normal sensation in V1, V2, and V3 segments bilaterally    CN VII no asymmetry, no nasolabial fold flattening    CN VIII normal hearing to speech    CN IX & X normal palatal elevation, no uvular deviation    CN XI 5/5 head turn and 5/5 shoulder shrug bilaterally    CN XII midline tongue protrusion    Motor:  Muscle bulk: normal, tone normal, pronator drift none tremor none Mvmt Root Nerve  Muscle Right Left Comments  SA C5/6 Ax Deltoid 5 5   EF C5/6 Mc Biceps 5 5   EE C6/7/8 Rad Triceps 5 5   WF C6/7 Med FCR     WE C7/8 PIN ECU     F Ab C8/T1 U ADM/FDI 5 5   HF L1/2/3 Fem Illopsoas 5 5   KE L2/3/4 Fem Quad 5 5   DF L4/5 D Peron Tib Ant 5 5   PF S1/2 Tibial Grc/Sol 5 5    Reflexes:  Right Left Comments  Pectoralis      Biceps (C5/6) 2 2   Brachioradialis (C5/6) 2 2    Triceps (C6/7) 2 2    Patellar (L3/4) 2 2    Achilles (S1)      Hoffman      Plantar     Jaw jerk    Sensation:  Light touch    Pin prick    Temperature    Vibration   Proprioception    Coordination/Complex Motor:  - Finger to Nose intact BL - Heel to shin intact BL - Rapid alternating movement are normal - Gait: unable to assess given she is hooked up to cEEG and several IV meds.  Labs   CBC:  Recent Labs  Lab 10/05/20 0930 10/06/20 0408  WBC 4.8 6.3  HGB 10.3*  10.7*  HCT 29.8* 31.6*  MCV 95.5 96.6  PLT 401* 419*    Basic Metabolic Panel:  Lab Results  Component Value Date   NA 128 (L) 10/06/2020   K 3.5 10/06/2020   CO2 20 (L) 10/06/2020   GLUCOSE 145 (H) 10/06/2020   BUN 5 (L) 10/06/2020   CREATININE 1.09 (H) 10/06/2020   CALCIUM 8.5 (L) 10/06/2020   GFRNONAA 59 (L) 10/06/2020   GFRAA >60 12/23/2017   Lipid Panel: No results found for: LDLCALC HgbA1c: No results found for: HGBA1C Urine Drug Screen:     Component Value Date/Time   LABOPIA POSITIVE (A) 10/05/2020 0921   COCAINSCRNUR NONE DETECTED 10/05/2020 0921   LABBENZ POSITIVE (A) 10/05/2020 0921   AMPHETMU NONE DETECTED 10/05/2020 [place] DETECTED 10/05/2020 0921   LABBARB NONE DETECTED 10/05/2020 0921    Alcohol Level     Component Value Date/Time   ETH <10 10/05/2020 1205    CT Head without contrast(personally reviewed): CTH was negative for a large hypodensity concerning for a large territory infarct or hyperdensity concerning for an ICH  MRI Brain(personally reviewed): No Acute stroke, no significant abnormality.  rEEG:  No seizure or epileptiform discharges.  cEEG: pending  Impression   YITZEL CATOE is a 60 y.o. female  with PMH significant for EtOH use of about 1 bottle of wine per day, Rheumatoid arthritis, hypertension who presented with 3 seizures. She quit alcohol 2 weeks prior to these so certainly is out of the window. Agree that these were likely provoked by hyponatremia. She has not had any tonic clonic seizures in the ICU since. Workup with MRI Brain and routine EEG are non revealing.   She does reports getting stuck on some words since being here. Also endorses feeling like it is difficult to concentrate and feels like she is in a brain fog. I think the most likely explanation for intermittent difficulty with words is delirium. I do not think that these are epileptic aphasia. Thiamine deficiency is another potential cause for her symptoms  which can cause wernicke's encephalopathy althou no signs of this on MRI, I do have some suspicion for potential confabulation as she seems to direct the conversation away from answering some of my questions. She also has 1/3 recall at 5 mins. Since there are no potential downsides to thiamine replacement, will do high dose thiamine replacement. She has been getting thiamine '100mg'$  daily here so obtaining levels at this point is not going to be helpful.  Impression: - Hyponatremia - Provoked seizures due to hyponatremia - Hx of EtOH use, quit on 09/20/20. - Delirium -  Recommendations  - cEEG. Can be discontinued in AM if no seizures - Thiamine '500mg'$  IV TID x 6 doses, followed by '250mg'$  daily x 5 doses, then thiamine '100mg'$  po or IV. Please transition her to '100mg'$  Thiamine PO daily at discharge. - No driving for 6 months. Has to be free from seizure for more than 6 months and cleared by neurology before she can resume driving. Since she is still somewhat delirious, would recommend reiterating this prior to discharge. ______________________________________________________________________   Thank you for the opportunity to take part in the care of this patient. If you have any further questions, please contact the neurology consultation attending.  Signed,  [place] Pager Number [phone] _ _ _   _ __   _ __ _ _  __ __   _ __   __ _

## 2020-10-07 DIAGNOSIS — R569 Unspecified convulsions: Secondary | ICD-10-CM

## 2020-10-07 DIAGNOSIS — R4182 Altered mental status, unspecified: Secondary | ICD-10-CM

## 2020-10-07 LAB — BASIC METABOLIC PANEL
Anion gap: 7 (ref 5–15)
Anion gap: 5 (ref 5–15)
BUN: <5 mg/dL — ABNORMAL LOW (ref 6–20)
BUN: <5 mg/dL — ABNORMAL LOW (ref 6–20)
CO2: 20 mmol/L — ABNORMAL LOW (ref 22–32)
CO2: 24 mmol/L (ref 22–32)
Calcium: 8.2 mg/dL — ABNORMAL LOW (ref 8.9–10.3)
Calcium: 8.6 mg/dL — ABNORMAL LOW (ref 8.9–10.3)
Chloride: 104 mmol/L (ref 98–111)
Chloride: 100 mmol/L (ref 98–111)
Creatinine, Ser: 0.89 mg/dL (ref 0.44–1.00)
Creatinine, Ser: 0.93 mg/dL (ref 0.44–1.00)
GFR, Estimated: >60 mL/min (ref >60)
GFR, Estimated: >60 mL/min (ref >60)
Glucose, Bld: 86 mg/dL (ref 70–99)
Glucose, Bld: 99 mg/dL (ref 70–99)
Potassium: 4.8 mmol/L (ref 3.5–5.1)
Potassium: 5.1 mmol/L (ref 3.5–5.1)
Sodium: 131 mmol/L — ABNORMAL LOW (ref 135–145)
Sodium: 129 mmol/L — ABNORMAL LOW (ref 135–145)

## 2020-10-07 LAB — CBC
HCT: 30.3 % — ABNORMAL LOW (ref 36.0–46.0)
Hemoglobin: 10.1 g/dL — ABNORMAL LOW (ref 12.0–15.0)
MCH: 32.8 pg (ref 26.0–34.0)
MCHC: 33.3 g/dL (ref 30.0–36.0)
MCV: 98.4 fL (ref 80.0–100.0)
Platelets: 470 10*3/uL — ABNORMAL HIGH (ref 150–400)
RBC: 3.08 MIL/uL — ABNORMAL LOW (ref 3.87–5.11)
RDW: 12.3 % (ref 11.5–15.5)
WBC: 5.1 10*3/uL (ref 4.0–10.5)
nRBC: 0 % (ref 0.0–0.2)

## 2020-10-07 LAB — GLUCOSE, CAPILLARY: Glucose-Capillary: 103 mg/dL — ABNORMAL HIGH (ref 70–99)

## 2020-10-07 MED ORDER — DM-GUAIFENESIN ER 30-600 MG PO TB12
1.0000 | ORAL_TABLET | Freq: Two times a day (BID) | ORAL | Status: DC
  Administered 2020-10-07 – 2020-10-09 (×5): 1 via ORAL
  Filled 2020-10-07 (×6): qty 1

## 2020-10-07 MED ORDER — FLUTICASONE PROPIONATE 50 MCG/ACT NA SUSP
1.0000 | Freq: Every day | NASAL | Status: DC | PRN
  Filled 2020-10-07 (×2): qty 16

## 2020-10-07 NOTE — Progress Notes (Signed)
Neurology Progress Note  Brief HPI: Beverly Dougherty is a 60 y.o. 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 Recently quit EtOH prior to her trip to [place] with last drink on august 15th. While in [place], had episode with vision going white and requiring assistance with walking and confusion. She was taken to a local hospital and noted to have a sodium of 110. Patient was not happy with her care at the hospital so she left the hospital and returned to [place]. At her PCP, her Na was 122. On 8/30, noted to have worsening confusion with a witnessed seizure upon EMS arrival and brought to [facility] ED. Had another seizure in the ED. Sodium here down to 118. CTH was negative, MRI brain wwo negative. She was admitted to the ICU for hyponatremia. Patient with reports of multiple episodes of getting stuck on word here and there and we were asked to assess her for possible seizures.  Interval History: Na 129 on 8/31, improved to 131 on 9/1 rEEG without seizures or epileptiform discharges cEEG overnight captured speech disturbance without concomitant EEG change.   Subjective: No acute overnight events Na improved to 131 today   Exam: Vitals:   10/07/20 1000 10/07/20 1108  BP: 98/82   Pulse: 79   Resp: 16   Temp:  97.8 F (36.6 C)  SpO2: 97%    Gen: Sitting up comfortably in ICU bed, in no acute distress Resp: non-labored breathing, no respiratory distress on room air Abd: soft, non-tender, non-distended  Neuro: Mental Status: Awake, alert, and oriented to person, place, age, month, year, and situation. She is able to provide a clear and coherent history of present illness.  Answers questions directly without needing redirection as noted overnight.  Speech is intact without dysarthria.  Naming, repetition, fluency, and comprehension are intact. Good attention noted.  Cranial Nerves: PERRL, EOMI without  ptosis, gaze preference, or nystagmus, visual fields are full, facial sensation intact and symmetric to light touch, face is symmetric resting and smiling, hearing is intact to voice, phonation is normal, shoulders shrug symmetrically, tongue protrudes midline without fasciculations.  Motor: 5/5 strength throughout without vertical drift. No asymmetry noted.  Tone and bulk are normal.  Sensory: Sensation to light touch is intact and symmetric in upper and lower extremities. DTR: 2+ and symmetric throughout Gait: Deferred  Pertinent Labs: CBC    Component Value Date/Time   WBC 5.1 10/07/2020 0742   RBC 3.08 (L) 10/07/2020 0742   HGB 10.1 (L) 10/07/2020 0742   HCT 30.3 (L) 10/07/2020 0742   PLT 470 (H) 10/07/2020 0742   MCV 98.4 10/07/2020 0742   MCH 32.8 10/07/2020 0742   MCHC 33.3 10/07/2020 0742   RDW 12.3 10/07/2020 0742   CMP     Component Value Date/Time   NA 131 (L) 10/07/2020 0742   K 4.8 10/07/2020 0742   CL 104 10/07/2020 0742   CO2 20 (L) 10/07/2020 0742   GLUCOSE 86 10/07/2020 0742   BUN <5 (L) 10/07/2020 0742   CREATININE 0.89 10/07/2020 0742   CALCIUM 8.2 (L) 10/07/2020 0742   PROT 6.1 (L) 10/05/2020 0930   ALBUMIN 3.6 10/05/2020 0930   AST 31 10/05/2020 0930   ALT 31 10/05/2020 0930   ALKPHOS 55 10/05/2020 0930   BILITOT 0.8 10/05/2020 0930   GFRNONAA >60 10/07/2020 0742   GFRAA >60 12/23/2017 1211  Imaging Reviewed:  Overnight EEG 10/06/2020 - 10/07/2020: "This study is within normal limits. No seizures or epileptiform discharges were seen throughout the recording. Multiple events were recorded during which patient speech disturbance as described above without concomitant EEG change.  These episodes were most likely not epileptic."  CT Head without contrast 10/05/2020: No acute intracranial pathology.  MRI Brain 10/06/2020:  No evidence of recent infarction, hemorrhage, or mass. No abnormal enhancement.   rEEG:  No seizure or epileptiform  discharges.  Assessment:  60 y.o. 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 She quit alcohol 2 weeks prior to these so certainly is out of the window for ETOH withdrawal seizures. Agree that these were likely provoked by hyponatremia. She has not had any tonic clonic seizures in the ICU since. Workup with MRI Brain and routine EEG are non revealing.    She does reports getting stuck on some words since being here. Also endorses feeling like it is difficult to concentrate and feels like she is in a brain fog. [place], on reassessment 10/07/2020 patient denies word-finding difficulties or difficulty with concentration. I think the most likely explanation for intermittent difficulty with words is delirium. I do not think that these are epileptic aphasia with events captured overnight on EEG without concomitant EEG change. These episodes are likely nonepileptic. Thiamine deficiency is another potential cause for her symptoms which can cause wernicke's encephalopathy although no signs of this on MRI, I do have some suspicion for potential confabulation as she seems to direct the conversation away from answering some of my questions. Since there are no potential downsides to thiamine replacement, will continue high dose thiamine replacement. She has been getting thiamine '100mg'$  daily here so obtaining levels at this point is not going to be helpful.  Impression:  - Hyponatremia; improving - Provoked seizures due to hyponatremia - Hx of EtOH use, quit on 09/20/20. - Delirium; improving  Recommendations: - Discontinue EEG monitoring  - Thiamine '500mg'$  IV TID x 6 doses, followed by '250mg'$  daily x 5 doses, then thiamine '100mg'$  po or IV. Please transition her to '100mg'$  Thiamine PO daily at discharge. - Follow up with outpatient neurology at discharge -  Discussed [facility] statutes, patients with seizures are not allowed to drive until  they have been seizure-free for six months. Use caution when using heavy equipment or power tools. Avoid working on ladders or at heights. Take showers instead of baths. Ensure the water temperature is not too high on the home water heater. Do not go swimming alone. Do not lock yourself in a room alone (i.e. bathroom). When caring for infants or small children, sit down when holding, feeding, or changing them to minimize risk of injury to the child in the event you have a seizure. Maintain good sleep hygiene. Avoid alcohol.  - No further neurology recommendations at this time. Neurology will be available on an as needed basis moving forward for further questions or concerns.   Anibal Henderson, AGACNP-BC [facility] [phone]

## 2020-10-07 NOTE — Progress Notes (Addendum)
PROGRESS NOTE  Beverly Dougherty  [id number] DOB: June 01, 1960 DOA: 10/05/2020 PCP: Derinda Late, MD   Brief Narrative: Beverly Dougherty is a 60 y.o. female with a history of HTN on HCTZ, RA on hydroxychloroquine who presented to the ED on PCP's advice due to hyponatremia associated with confusion. This was initially noted on a trip to [place] in the setting of viral gastroenteritis-induced diarrhea requiring hospitalization diarrhea and improved allowing her to fly home. At PCP follow up, Na said to be 110. After arrival to the ED she had a witnessed tonic-clonic seizure, Na 122. Hypertonic saline was given for symptomatic hypernatremia, neurology consulted, initiated cEEG, and patient admitted to ICU. Mental status has improved, and no seizures have been noted on continuous EEG. Sodium level has improved at goal rate after switching to isotonic saline. She was transferred to hospitalist service and Na is 131 on 9/1.    Assessment & Plan: Active Problems:   Hyponatremia  Seizure: Provoked by severe hyponatremia.  - Neurology consulted, recommending vitamin supplementation, and 6 month driving restriction. Continuous EEG detected no epileptic activity. Will not initiate on AEDs.   Symptomatic hyponatremia: Na improving at goal rate initially with hypertonic saline, then isotonic saline.  - Continue holding thiazide, monitor Na serially with just fluid restriction.   Acute metabolic encephalopathy: Due to hyponatremia primarily as mentation has improved considerably with correction. Brain MRI negative.  Alcohol use: Last EtOH said to be weeks ago, EtOH was indeed negative on admission and there are no lingering suggestions of alcohol withdrawal. LFTs wnl, RUQ U/S without morphologic evidence of cirrhosis.  - Complete cessation recommended.  - Per neurology: Thiamine '500mg'$  IV TID x 6 doses, followed by '250mg'$  daily x 5 doses, then thiamine '100mg'$  po or IV. Please transition her to '100mg'$  Thiamine  PO daily at discharge.  Hypomagnesemia:  - Supplement prn  Rheumatoid arthritis: Quiescent.  - Restart home medications  Vulvovaginal candidiasis s/p fluconazole x1  Congestion:  - Mucinex, flonase prn  DVT prophylaxis: Lovenox Code Status: Full Family Communication: None at bedside Disposition Plan:  Status is: Inpatient  Remains inpatient appropriate because:Persistent severe electrolyte disturbances and Ongoing diagnostic testing needed not appropriate for outpatient work up  Dispo: The patient is from: Home              Anticipated d/c is to: Home              Patient currently is not medically stable to d/c.  Consultants:  PCCM Neurology  Procedures:  Continuous EEG 10/06/2020 1642 to 10/07/2020 1000 Level of alertness: Awake, asleep   AEDs during EEG study: None   Technical aspects: This EEG study was done with scalp electrodes positioned according to the 10-20 International system of electrode placement. Electrical activity was acquired at a sampling rate of '500Hz'$  and reviewed with a high frequency filter of '70Hz'$  and a low frequency filter of '1Hz'$ . EEG data were recorded continuously and digitally stored.    Description: The posterior dominant rhythm consists of 9.5 Hz activity of moderate voltage (25-35 uV) seen predominantly in posterior head regions, symmetric and reactive to eye opening and eye closing. Sleep was characterized by vertex waves, sleep spindles (12 to 14 Hz), maximum frontocentral region. Hyperventilation and photic stimulation were not performed.      Event button was pressed on 09/18/2020 at [place], [place], 2054, 2235 and 2316. Patient reported losing time and having a hard time finding her words. Concomitant EEG before, during and after the  event did not show any EEG changes suggest seizure.   IMPRESSION: This study is within normal limits. No seizures or epileptiform discharges were seen throughout the recording.   Multiple events were recorded during  which patient speech disturbance as described above without concomitant EEG change.  These episodes were most likely not epileptic.   Priyanka Barbra Sarks   Antimicrobials: Fluconazole x1   Subjective: Feeling some nasal congestion, PND, requesting mucinex. Had normal BM yesterday, no diarrhea or abd pain. Feels she's thinking clearly, wants to go home as soon as possible. Has not gotten OOB yet.   Objective: Vitals:   10/07/20 0801 10/07/20 0900 10/07/20 1000 10/07/20 1108  BP:  109/72 98/82   Pulse:  91 79   Resp:  (!) 27 16   Temp: 98.3 F (36.8 C)   97.8 F (36.6 C)  TempSrc: Oral   Oral  SpO2:  (!) 88% 97%   Weight:      Height:        Intake/Output Summary (Last 24 hours) at 10/07/2020 1227 Last data filed at 10/07/2020 [id number] Gross per 24 hour  Intake 2263.27 ml  Output 2051 ml  Net 212.27 ml   Filed Weights   10/05/20 0947 10/06/20 0500  Weight: 59.5 kg 55.8 kg    Gen: 60 y.o. female in no distress Pulm: Non-labored breathing room air. Clear to auscultation bilaterally.  CV: Regular rate and rhythm. No murmur, rub, or gallop. No JVD, no pedal edema. GI: Abdomen soft, non-tender, non-distended, with normoactive bowel sounds. No organomegaly or masses felt. Ext: Warm, no deformities Skin: No rashes, lesions or ulcers Neuro: Alert and oriented. No focal neurological deficits. Psych: Judgement and insight appear normal. Mood & affect appropriate.   Data Reviewed: I have personally reviewed following labs and imaging studies  CBC: Recent Labs  Lab 10/05/20 0930 10/06/20 0408 10/07/20 0742  WBC 4.8 6.3 5.1  HGB 10.3* 10.7* 10.1*  HCT 29.8* 31.6* 30.3*  MCV 95.5 96.6 98.4  PLT 401* 419* [id number]*   Basic Metabolic Panel: Recent Labs  Lab 10/05/20 0930 10/05/20 1811 10/06/20 0056 10/06/20 0408 10/06/20 0800 10/06/20 1840 10/07/20 0742  NA 122*   < > 128* 127* 129* 128* 131*  K 4.1  --   --  3.9  --  3.5 4.8  CL 92*  --   --  97*  --  96* 104  CO2 22  --   --   19*  --  20* 20*  GLUCOSE 110*  --   --  82  --  145* 86  BUN 6  --   --  5*  --  5* <5*  CREATININE 0.81  --   --  0.76  --  1.09* 0.89  CALCIUM 8.9  --   --  8.6*  --  8.5* 8.2*  MG 1.8  --   --  2.3  --   --   --   PHOS  --   --   --  3.8  --   --   --    < > = values in this interval not displayed.   GFR: Estimated Creatinine Clearance: 59.5 mL/min (by C-G formula based on SCr of 0.89 mg/dL). Liver Function Tests: Recent Labs  Lab 10/05/20 0930  AST 31  ALT 31  ALKPHOS 55  BILITOT 0.8  PROT 6.1*  ALBUMIN 3.6   No results for input(s): LIPASE, AMYLASE in the last 168 hours. Recent Labs  Lab  10/05/20 0930  AMMONIA 12   Coagulation Profile: No results for input(s): INR, PROTIME in the last 168 hours. Cardiac Enzymes: No results for input(s): CKTOTAL, CKMB, CKMBINDEX, TROPONINI in the last 168 hours. BNP (last 3 results) No results for input(s): PROBNP in the last 8760 hours. HbA1C: No results for input(s): HGBA1C in the last 72 hours. CBG: Recent Labs  Lab 10/05/20 1010 10/06/20 1955 10/06/20 2353 10/07/20 0340  GLUCAP 111* 132* 112* 103*   Lipid Profile: No results for input(s): CHOL, HDL, LDLCALC, TRIG, CHOLHDL, LDLDIRECT in the last 72 hours. Thyroid Function Tests: No results for input(s): TSH, T4TOTAL, FREET4, T3FREE, THYROIDAB in the last 72 hours. Anemia Panel: No results for input(s): VITAMINB12, FOLATE, FERRITIN, TIBC, IRON, RETICCTPCT in the last 72 hours. Urine analysis:    Component Value Date/Time   COLORURINE YELLOW 04/17/2015 2130   APPEARANCEUR CLOUDY (A) 04/17/2015 2130   LABSPEC 1.005 04/17/2015 2130   PHURINE 5.5 04/17/2015 2130   GLUCOSEU NEGATIVE 04/17/2015 2130   HGBUR NEGATIVE 04/17/2015 2130   [place] NEGATIVE 04/17/2015 2130   [place] NEGATIVE 04/17/2015 2130   PROTEINUR NEGATIVE 04/17/2015 2130   NITRITE NEGATIVE 04/17/2015 2130   LEUKOCYTESUR NEGATIVE 04/17/2015 2130   Recent Results (from the past 240 hour(s))  Resp  Panel by RT-PCR (Flu A&B, Covid) Nasopharyngeal Swab     Status: None   Collection Time: 10/05/20 11:09 AM   Specimen: Nasopharyngeal Swab; Nasopharyngeal(NP) swabs in vial transport medium  Result Value Ref Range Status   SARS Coronavirus 2 by RT PCR NEGATIVE NEGATIVE Final    Comment: (NOTE) SARS-CoV-2 target nucleic acids are NOT DETECTED.  The SARS-CoV-2 RNA is generally detectable in upper respiratory specimens during the acute phase of infection. The lowest concentration of SARS-CoV-2 viral copies this assay can detect is 138 copies/mL. A negative result does not preclude SARS-Cov-2 infection and should not be used as the sole basis for treatment or other patient management decisions. A negative result may occur with  improper specimen collection/handling, submission of specimen other than nasopharyngeal swab, presence of viral mutation(s) within the areas targeted by this assay, and inadequate number of viral copies(<138 copies/mL). A negative result must be combined with clinical observations, patient history, and epidemiological information. The expected result is Negative.  Fact Sheet for Patients:  EntrepreneurPulse.com.au  Fact Sheet for Healthcare Providers:  IncredibleEmployment.be  This test is no t yet approved or cleared by the [place] FDA and  has been authorized for detection and/or diagnosis of SARS-CoV-2 by FDA under an Emergency Use Authorization (EUA). This EUA will remain  in effect (meaning this test can be used) for the duration of the COVID-19 declaration under Section 564(b)(1) of the Act, 21 U.S.C.section 360bbb-3(b)(1), unless the authorization is terminated  or revoked sooner.       Influenza A by PCR NEGATIVE NEGATIVE Final   Influenza B by PCR NEGATIVE NEGATIVE Final    Comment: (NOTE) The Xpert Xpress SARS-CoV-2/FLU/RSV plus assay is intended as an aid in the diagnosis of influenza from Nasopharyngeal  swab specimens and should not be used as a sole basis for treatment. Nasal washings and aspirates are unacceptable for Xpert Xpress SARS-CoV-2/FLU/RSV testing.  Fact Sheet for Patients: EntrepreneurPulse.com.au  Fact Sheet for Healthcare Providers: IncredibleEmployment.be  This test is not yet approved or cleared by the [place] FDA and has been authorized for detection and/or diagnosis of SARS-CoV-2 by FDA under an Emergency Use Authorization (EUA). This EUA will remain in effect (meaning this test  can be used) for the duration of the COVID-19 declaration under Section 564(b)(1) of the Act, 21 U.S.C. section 360bbb-3(b)(1), unless the authorization is terminated or revoked.  Performed at [facility], [place] [address], [place], [place] [address]   MRSA Next Gen by PCR, Nasal     Status: None   Collection Time: 10/05/20  8:34 PM   Specimen: Nasal Mucosa; Nasal Swab  Result Value Ref Range Status   MRSA by PCR Next Gen NOT DETECTED NOT DETECTED Final    Comment: (NOTE) The GeneXpert MRSA Assay (FDA approved for NASAL specimens only), is one component of a comprehensive MRSA colonization surveillance program. It is not intended to diagnose MRSA infection nor to guide or monitor treatment for MRSA infections. Test performance is not FDA approved in patients less than 39 years old. Performed at [facility], [place] [address] [place],  [address]       Radiology Studies: MR BRAIN W WO CONTRAST  Result Date: 10/06/2020 CLINICAL DATA:  Dizziness, nonspecific; episode of seizure EXAM: MRI HEAD WITHOUT AND WITH CONTRAST TECHNIQUE: Multiplanar, multiecho pulse sequences of the brain and surrounding structures were obtained without and with intravenous contrast. CONTRAST:  5.45m GADAVIST GADOBUTROL 1 MMOL/ML IV SOLN COMPARISON:  2015 FINDINGS: Brain: There is no acute infarction or intracranial hemorrhage. There is no intracranial  mass, mass effect, or edema. There is no hydrocephalus or extra-axial fluid collection. Ventricles and sulci are within normal limits in size and configuration. No abnormal enhancement. Vascular: Major vessel flow voids at the skull base are preserved. Skull and upper cervical spine: Normal marrow signal is preserved. Sinuses/Orbits: Paranasal sinuses are aerated. Orbits are unremarkable. Other: Sella is unremarkable.  Mastoid air cells are clear. IMPRESSION: No evidence of recent infarction, hemorrhage, or mass. No abnormal enhancement. Electronically Signed   By: PMacy MisM.D.   On: 10/06/2020 15:34   EEG adult  Result Date: 10/05/2020 YLora Havens MD     10/05/2020  7:00 PM Patient Name: SALYSON LYKKENMRN: [id number] Attending: PLora HavensReferring Physician/Provider: PAwanda Mink NP Date: 10/05/2020 Duration: 22.57 mins Patient history: 60y/o female with a history of alcohol abuse presented today in the setting of confusion and hyponatremia.  She had seizure this morning.  EEG to evaluate for seizure. Level of alertness: Awake AEDs during EEG study: None Technical aspects: This EEG study was done with scalp electrodes positioned according to the 10-20 International system of electrode placement. Electrical activity was acquired at a sampling rate of '500Hz'$  and reviewed with a high frequency filter of '70Hz'$  and a low frequency filter of '1Hz'$ . EEG data were recorded continuously and digitally stored. Description: The posterior dominant rhythm consists of 9.5 Hz activity of moderate voltage (25-35 uV) seen predominantly in posterior head regions, symmetric and reactive to eye opening and eye closing. Hyperventilation and photic stimulation were not performed.   IMPRESSION: This study is within normal limits. No seizures or epileptiform discharges were seen throughout the recording. Priyanka OBarbra Sarks  Overnight EEG with video  Result Date: 10/07/2020 YLora Havens MD      10/07/2020  9:56 AM Patient Name: SVANNESA FOGELBERGMRN: [id number] Attending: PLora HavensReferring Physician/Provider: Dr CSu MonksDuration: 10/06/2020 1642 to 10/07/2020 1000  Patient history: 60y/o female with a history of alcohol abuse presented today in the setting of confusion and hyponatremia.  She had seizure this morning.  EEG to evaluate for seizure.  Level of alertness: Awake, asleep  AEDs during EEG study: None  Technical aspects: This EEG study was done with scalp electrodes positioned according to the 10-20 International system of electrode placement. Electrical activity was acquired at a sampling rate of '500Hz'$  and reviewed with a high frequency filter of '70Hz'$  and a low frequency filter of '1Hz'$ . EEG data were recorded continuously and digitally stored.  Description: The posterior dominant rhythm consists of 9.5 Hz activity of moderate voltage (25-35 uV) seen predominantly in posterior head regions, symmetric and reactive to eye opening and eye closing. Sleep was characterized by vertex waves, sleep spindles (12 to 14 Hz), maximum frontocentral region. Hyperventilation and photic stimulation were not performed.   Event button was pressed on 09/18/2020 at [place], [place], 2054, 2235 and 2316. Patient reported losing time and having a hard time finding her words. Concomitant EEG before, during and after the event did not show any EEG changes suggest seizure.  IMPRESSION: This study is within normal limits. No seizures or epileptiform discharges were seen throughout the recording. Multiple events were recorded during which patient speech disturbance as described above without concomitant EEG change.  These episodes were most likely not epileptic.  Priyanka Barbra Sarks   US Abdomen Limited RUQ (LIVER/GB)  Result Date: 10/05/2020 CLINICAL DATA:  Pain EXAM: ULTRASOUND ABDOMEN LIMITED RIGHT UPPER QUADRANT COMPARISON:  None. FINDINGS: Gallbladder: No gallstones or wall thickening visualized. No  sonographic Murphy sign noted by sonographer. Common bile duct: Diameter: 1.3 Liver: No focal lesion identified. Increased parenchymal echogenicity. Portal vein is patent on color Doppler imaging with normal direction of blood flow towards the liver. Other: None. IMPRESSION: Normal sonographic appearance of the gallbladder. Hepatic steatosis. Electronically Signed   By: Yetta Glassman M.D.   On: 10/05/2020 16:07    Scheduled Meds:  ALPRAZolam  0.5 mg Oral QHS   Chlorhexidine Gluconate Cloth  6 each Topical Q0600   dextromethorphan-guaiFENesin  1 tablet Oral BID   enoxaparin (LOVENOX) injection  40 mg Subcutaneous [id number]   folic acid  1 mg Oral Daily   hydroxychloroquine  100 mg Oral QPM   hydroxychloroquine  200 mg Oral q morning   meloxicam  15 mg Oral Daily   multivitamin with minerals  1 tablet Oral Daily   [START ON 10/15/2020] thiamine injection  100 mg Intravenous Daily   Continuous Infusions:  thiamine injection 500 mg (10/07/20 0646)   Followed by   Derrill Memo ON 10/09/2020] thiamine injection       LOS: 2 days   Time spent: 35 minutes.  Patrecia Pour, MD [facility] [email or web address] 10/07/2020, 12:27 PM

## 2020-10-07 NOTE — Procedures (Addendum)
Patient Name: Beverly Dougherty  MRN: [id number]  Epilepsy Attending: Lora Havens  Referring Physician/Provider: Dr Su Monks Duration: 10/06/2020 1642 to 10/07/2020 1058   Patient history: 60 y/o female  presented today in the setting of confusion and hyponatremia.  She had seizure this morning.  EEG to evaluate for seizure.   Level of alertness: Awake, asleep   AEDs during EEG study: None   Technical aspects: This EEG study was done with scalp electrodes positioned according to the 10-20 International system of electrode placement. Electrical activity was acquired at a sampling rate of '500Hz'$  and reviewed with a high frequency filter of '70Hz'$  and a low frequency filter of '1Hz'$ . EEG data were recorded continuously and digitally stored.    Description: The posterior dominant rhythm consists of 9.5 Hz activity of moderate voltage (25-35 uV) seen predominantly in posterior head regions, symmetric and reactive to eye opening and eye closing. Sleep was characterized by vertex waves, sleep spindles (12 to 14 Hz), maximum frontocentral region. Hyperventilation and photic stimulation were not performed.     Event button was pressed on 09/18/2020 at [place], [place], 2054, 2235 and 2316. Patient reported losing time and having a hard time finding her words. Concomitant EEG before, during and after the event did not show any EEG changes suggest seizure.   IMPRESSION: This study is within normal limits. No seizures or epileptiform discharges were seen throughout the recording.  Multiple events were recorded during which patient speech disturbance as described above without concomitant EEG change.  These episodes were most likely not epileptic.   [name]

## 2020-10-07 NOTE — Progress Notes (Signed)
Discontinued cEEG study.  Notified [facility] monitoring. Mild skin breakdown observed at electrode sites F7, Fp1, Fp2, F8.

## 2020-10-08 LAB — BASIC METABOLIC PANEL
Anion gap: 7 (ref 5–15)
BUN: <5 mg/dL — ABNORMAL LOW (ref 6–20)
CO2: 22 mmol/L (ref 22–32)
Calcium: 8.6 mg/dL — ABNORMAL LOW (ref 8.9–10.3)
Chloride: 99 mmol/L (ref 98–111)
Creatinine, Ser: 0.89 mg/dL (ref 0.44–1.00)
GFR, Estimated: >60 mL/min (ref >60)
Glucose, Bld: 102 mg/dL — ABNORMAL HIGH (ref 70–99)
Potassium: 4.8 mmol/L (ref 3.5–5.1)
Sodium: 128 mmol/L — ABNORMAL LOW (ref 135–145)

## 2020-10-08 LAB — OSMOLALITY, URINE: Osmolality, Ur: 509 mOsm/kg (ref 300–900)

## 2020-10-08 LAB — SODIUM, URINE, RANDOM: Sodium, Ur: 141 mmol/L

## 2020-10-08 MED ORDER — HYDROCODONE-ACETAMINOPHEN 5-325 MG PO TABS
1.0000 | ORAL_TABLET | Freq: Two times a day (BID) | ORAL | Status: DC | PRN
Start: 2020-10-08 — End: 2020-10-09
  Administered 2020-10-08: 1 via ORAL
  Filled 2020-10-08: qty 1

## 2020-10-08 MED ORDER — SODIUM CHLORIDE 1 G PO TABS
1.0000 g | ORAL_TABLET | Freq: Two times a day (BID) | ORAL | Status: DC
  Administered 2020-10-08 – 2020-10-09 (×3): 1 g via ORAL
  Filled 2020-10-08 (×5): qty 1

## 2020-10-08 MED ORDER — FUROSEMIDE 40 MG PO TABS
20.0000 mg | ORAL_TABLET | Freq: Once | ORAL | Status: AC
  Administered 2020-10-08: 20 mg via ORAL
  Filled 2020-10-08: qty 1

## 2020-10-08 NOTE — Progress Notes (Signed)
Patient transferred from 50M to 5W09. Patient is alert and oriented to person, place, time, and situation. Telemetry monitoring enabled, vital signs taken, and IVs assessed for patency. Skin checked with Roxanne Gates. Fall precautions initiated. Patient bed in the locked, lowest position. Non-slip socks in place and bed alarm on. Call bell is within reach. Patient knows to call for assistance prior to getting up and patient demonstrates use. Patient is laying comfortably in bed.

## 2020-10-08 NOTE — Progress Notes (Addendum)
PROGRESS NOTE  Beverly Dougherty  [id number] DOB: [birth year] DOA: 10/05/2020 PCP: Derinda Late, MD   Brief Narrative: Beverly Dougherty is a 60 y.o. female with a history of HTN on HCTZ, RA on hydroxychloroquine who presented to the ED on PCP's advice due to hyponatremia associated with confusion. This was initially noted on a trip to [place] in the setting of viral gastroenteritis-induced diarrhea requiring hospitalization diarrhea and improved allowing her to fly home. At PCP follow up, Na said to be 110. After arrival to the ED she had a witnessed tonic-clonic seizure, Na 122. Hypertonic saline was given for symptomatic hypernatremia, neurology consulted, initiated cEEG, and patient admitted to ICU. Mental status has improved, and no seizures have been noted on continuous EEG. Sodium level has improved at goal rate after switching to isotonic saline. She was transferred to hospitalist service and Na is 131 on 9/1. With discontinuation of therapies, sodium has crept back down slowly. Fluid restriction and salt tabs are instituted.  Assessment & Plan: Active Problems:   Hyponatremia   Altered mental status   Seizure-like activity (HCC)  Seizure: Provoked by severe hyponatremia.  - Neurology consulted, recommending vitamin supplementation, and 6 month driving restriction. Continuous EEG detected no epileptic activity. Will not initiate on AEDs.   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 Appears euvolemic. - Continue holding thiazide. Institute fluid restriction. Check urine osm, Na. >> Both elevated, would benefit from low dose lasix. Will add salt tab low dose and check in AM.  - TSH has not resulted, will check. Cortisol appears to be sufficient. - With suspicion for SIADH in the absence of frequently associated medications, tobacco use histry (albeit < 10 pack-years), would consider cross-sectional imaging of the chest if  hyponatremia persists given associated with SCLC.  Acute metabolic encephalopathy: Due to hyponatremia primarily as mentation has improved considerably with correction. Brain MRI negative.  Alcohol use: Last EtOH said to be weeks ago, EtOH was indeed negative on admission and there are no lingering suggestions of alcohol withdrawal. LFTs wnl, RUQ U/S without morphologic evidence of cirrhosis.  - Complete cessation recommended.  - Per neurology: Thiamine '500mg'$  IV TID x 6 doses, followed by '250mg'$  daily x 5 doses, then thiamine '100mg'$  po or IV. - Will plan on '100mg'$  Thiamine PO daily at discharge.  Hypomagnesemia:  - Supplement prn  Rheumatoid arthritis: Quiescent.  - Restarted home medications  Vulvovaginal candidiasis s/p fluconazole x1  Congestion:  - Mucinex, flonase prn  DVT prophylaxis: Lovenox Code Status: Full Family Communication: Husband at bedside Disposition Plan:  Status is: Inpatient  Remains inpatient appropriate because:Ongoing diagnostic testing needed not appropriate for outpatient work up  Dispo: The patient is from: Home              Anticipated d/c is to: Home              Patient currently is not medically stable to d/c.  Consultants:  PCCM Neurology  Procedures:  Continuous EEG 10/06/2020 1642 to 10/07/2020 1000 Level of alertness: Awake, asleep   AEDs during EEG study: None   Technical aspects: This EEG study was done with scalp electrodes positioned according to the 10-20 International system of electrode placement. Electrical activity was acquired at a sampling rate of '500Hz'$  and reviewed with a high frequency filter of '70Hz'$  and a low frequency filter of '1Hz'$ . EEG data were recorded continuously and digitally stored.    Description: The posterior  dominant rhythm consists of 9.5 Hz activity of moderate voltage (25-35 uV) seen predominantly in posterior head regions, symmetric and reactive to eye opening and eye closing. Sleep was characterized by vertex  waves, sleep spindles (12 to 14 Hz), maximum frontocentral region. Hyperventilation and photic stimulation were not performed.      Event button was pressed on 09/18/2020 at [place], [place], 2054, 2235 and 2316. Patient reported losing time and having a hard time finding her words. Concomitant EEG before, during and after the event did not show any EEG changes suggest seizure.   IMPRESSION: This study is within normal limits. No seizures or epileptiform discharges were seen throughout the recording.   Multiple events were recorded during which patient speech disturbance as described above without concomitant EEG change.  These episodes were most likely not epileptic.   Beverly Dougherty   Antimicrobials: Fluconazole x1   Subjective: Feeling much better, her husband states she's nearly back to herself again. She's eating ok, normal urine output.   Objective: Vitals:   10/08/20 0447 10/08/20 1105 10/08/20 1525 10/08/20 1723  BP: (!) 127/92   (!) 143/75  Pulse:  84  77  Resp: 19     Temp: 98.4 F (36.9 C)  97.9 F (36.6 C)   TempSrc: Oral  Oral   SpO2:    97%  Weight:      Height:        Intake/Output Summary (Last 24 hours) at 10/08/2020 1735 Last data filed at 10/08/2020 1600 Gross per 24 hour  Intake 204.79 ml  Output 1280 ml  Net -1075.21 ml   Filed Weights   10/05/20 0947 10/06/20 0500  Weight: 59.5 kg 55.8 kg   Gen: 61 y.o. female in no distress Pulm: Nonlabored breathing room air. Clear. CV: Regular rate and rhythm. No murmur, rub, or gallop. No JVD, no dependent edema. GI: Abdomen soft, non-tender, non-distended, with normoactive bowel sounds.  Ext: Warm, no deformities Skin: No rashes, lesions or ulcers on visualized skin. Neuro: Alert and oriented. No focal neurological deficits. Psych: Judgement and insight appear fair. Mood euthymic & affect congruent. Behavior is appropriate.    Data Reviewed: I have personally reviewed following labs and imaging  studies  CBC: Recent Labs  Lab 10/05/20 0930 10/06/20 0408 10/07/20 0742  WBC 4.8 6.3 5.1  HGB 10.3* 10.7* 10.1*  HCT 29.8* 31.6* 30.3*  MCV 95.5 96.6 98.4  PLT 401* 419* [id number]*   Basic Metabolic Panel: Recent Labs  Lab 10/05/20 0930 10/05/20 1811 10/06/20 0408 10/06/20 0800 10/06/20 1840 10/07/20 0742 10/07/20 1859 10/08/20 0803  NA 122*   < > 127* 129* 128* 131* 129* 128*  K 4.1  --  3.9  --  3.5 4.8 5.1 4.8  CL 92*  --  97*  --  96* 104 100 99  CO2 22  --  19*  --  20* 20* 24 22  GLUCOSE 110*  --  82  --  145* 86 99 102*  BUN 6  --  5*  --  5* <5* <5* <5*  CREATININE 0.81  --  0.76  --  1.09* 0.89 0.93 0.89  CALCIUM 8.9  --  8.6*  --  8.5* 8.2* 8.6* 8.6*  MG 1.8  --  2.3  --   --   --   --   --   PHOS  --   --  3.8  --   --   --   --   --    < > =  values in this interval not displayed.   GFR: Estimated Creatinine Clearance: 59.5 mL/min (by C-G formula based on SCr of 0.89 mg/dL). Liver Function Tests: Recent Labs  Lab 10/05/20 0930  AST 31  ALT 31  ALKPHOS 55  BILITOT 0.8  PROT 6.1*  ALBUMIN 3.6   No results for input(s): LIPASE, AMYLASE in the last 168 hours. Recent Labs  Lab 10/05/20 0930  AMMONIA 12   Coagulation Profile: No results for input(s): INR, PROTIME in the last 168 hours. Cardiac Enzymes: No results for input(s): CKTOTAL, CKMB, CKMBINDEX, TROPONINI in the last 168 hours. BNP (last 3 results) No results for input(s): PROBNP in the last 8760 hours. HbA1C: No results for input(s): HGBA1C in the last 72 hours. CBG: Recent Labs  Lab 10/05/20 1010 10/06/20 1955 10/06/20 2353 10/07/20 0340  GLUCAP 111* 132* 112* 103*   Lipid Profile: No results for input(s): CHOL, HDL, LDLCALC, TRIG, CHOLHDL, LDLDIRECT in the last 72 hours. Thyroid Function Tests: No results for input(s): TSH, T4TOTAL, FREET4, T3FREE, THYROIDAB in the last 72 hours. Anemia Panel: No results for input(s): VITAMINB12, FOLATE, FERRITIN, TIBC, IRON, RETICCTPCT in the  last 72 hours. Urine analysis:    Component Value Date/Time   COLORURINE YELLOW 04/17/2015 2130   APPEARANCEUR CLOUDY (A) 04/17/2015 2130   LABSPEC 1.005 04/17/2015 2130   PHURINE 5.5 04/17/2015 2130   GLUCOSEU NEGATIVE 04/17/2015 2130   HGBUR NEGATIVE 04/17/2015 2130   [place] NEGATIVE 04/17/2015 2130   [place] NEGATIVE 04/17/2015 2130   PROTEINUR NEGATIVE 04/17/2015 2130   NITRITE NEGATIVE 04/17/2015 2130   LEUKOCYTESUR NEGATIVE 04/17/2015 2130   Recent Results (from the past 240 hour(s))  Resp Panel by RT-PCR (Flu A&B, Covid) Nasopharyngeal Swab     Status: None   Collection Time: 10/05/20 11:09 AM   Specimen: Nasopharyngeal Swab; Nasopharyngeal(NP) swabs in vial transport medium  Result Value Ref Range Status   SARS Coronavirus 2 by RT PCR NEGATIVE NEGATIVE Final    Comment: (NOTE) SARS-CoV-2 target nucleic acids are NOT DETECTED.  The SARS-CoV-2 RNA is generally detectable in upper respiratory specimens during the acute phase of infection. The lowest concentration of SARS-CoV-2 viral copies this assay can detect is 138 copies/mL. A negative result does not preclude SARS-Cov-2 infection and should not be used as the sole basis for treatment or other patient management decisions. A negative result may occur with  improper specimen collection/handling, submission of specimen other than nasopharyngeal swab, presence of viral mutation(s) within the areas targeted by this assay, and inadequate number of viral copies(<138 copies/mL). A negative result must be combined with clinical observations, patient history, and epidemiological information. The expected result is Negative.  Fact Sheet for Patients:  EntrepreneurPulse.com.au  Fact Sheet for Healthcare Providers:  IncredibleEmployment.be  This test is no t yet approved or cleared by the [place] FDA and  has been authorized for detection and/or diagnosis of SARS-CoV-2 by FDA  under an Emergency Use Authorization (EUA). This EUA will remain  in effect (meaning this test can be used) for the duration of the COVID-19 declaration under Section 564(b)(1) of the Act, 21 U.S.C.section 360bbb-3(b)(1), unless the authorization is terminated  or revoked sooner.       Influenza A by PCR NEGATIVE NEGATIVE Final   Influenza B by PCR NEGATIVE NEGATIVE Final    Comment: (NOTE) The Xpert Xpress SARS-CoV-2/FLU/RSV plus assay is intended as an aid in the diagnosis of influenza from Nasopharyngeal swab specimens and should not be used as a sole basis  for treatment. Nasal washings and aspirates are unacceptable for Xpert Xpress SARS-CoV-2/FLU/RSV testing.  Fact Sheet for Patients: EntrepreneurPulse.com.au  Fact Sheet for Healthcare Providers: IncredibleEmployment.be  This test is not yet approved or cleared by the [place] FDA and has been authorized for detection and/or diagnosis of SARS-CoV-2 by FDA under an Emergency Use Authorization (EUA). This EUA will remain in effect (meaning this test can be used) for the duration of the COVID-19 declaration under Section 564(b)(1) of the Act, 21 U.S.C. section 360bbb-3(b)(1), unless the authorization is terminated or revoked.  Performed at [facility], [place] [address] [place], [place] [address]   MRSA Next Gen by PCR, Nasal     Status: None   Collection Time: 10/05/20  8:34 PM   Specimen: Nasal Mucosa; Nasal Swab  Result Value Ref Range Status   MRSA by PCR Next Gen NOT DETECTED NOT DETECTED Final    Comment: (NOTE) The GeneXpert MRSA Assay (FDA approved for NASAL specimens only), is one component of a comprehensive MRSA colonization surveillance program. It is not intended to diagnose MRSA infection nor to guide or monitor treatment for MRSA infections. Test performance is not FDA approved in patients less than 50 years old. Performed at [facility], [place]  [address], [place], [place] [address]       Radiology Studies: Overnight EEG with video  Result Date: 10/07/2020 Lora Havens, MD     10/08/2020 11:38 AM Patient Name: Beverly Dougherty MRN: [id number] Epilepsy Attending: Lora Havens Referring Physician/Provider: Dr Su Monks Duration: 10/06/2020 1642 to 10/07/2020 1058  Patient history: 60 y/o female with a history of alcohol abuse presented today in the setting of confusion and hyponatremia.  She had seizure this morning.  EEG to evaluate for seizure.  Level of alertness: Awake, asleep  AEDs during EEG study: None  Technical aspects: This EEG study was done with scalp electrodes positioned according to the 10-20 International system of electrode placement. Electrical activity was acquired at a sampling rate of '500Hz'$  and reviewed with a high frequency filter of '70Hz'$  and a low frequency filter of '1Hz'$ . EEG data were recorded continuously and digitally stored.  Description: The posterior dominant rhythm consists of 9.5 Hz activity of moderate voltage (25-35 uV) seen predominantly in posterior head regions, symmetric and reactive to eye opening and eye closing. Sleep was characterized by vertex waves, sleep spindles (12 to 14 Hz), maximum frontocentral region. Hyperventilation and photic stimulation were not performed.   Event button was pressed on 09/18/2020 at [place], [place], 2054, 2235 and 2316. Patient reported losing time and having a hard time finding her words. Concomitant EEG before, during and after the event did not show any EEG changes suggest seizure.  IMPRESSION: This study is within normal limits. No seizures or epileptiform discharges were seen throughout the recording. Multiple events were recorded during which patient speech disturbance as described above without concomitant EEG change.  These episodes were most likely not epileptic.  Beverly Dougherty    Scheduled Meds:  ALPRAZolam  0.5 mg Oral QHS   Chlorhexidine Gluconate Cloth  6 each Topical  Q0600   dextromethorphan-guaiFENesin  1 tablet Oral BID   enoxaparin (LOVENOX) injection  40 mg Subcutaneous [id number]   folic acid  1 mg Oral Daily   hydroxychloroquine  100 mg Oral QPM   hydroxychloroquine  200 mg Oral q morning   meloxicam  15 mg Oral Daily   multivitamin with minerals  1 tablet Oral  Daily   sodium chloride  1 g Oral BID WC   [START ON 10/15/2020] thiamine injection  100 mg Intravenous Daily   Continuous Infusions:  thiamine injection 500 mg (10/08/20 1718)   Followed by   Derrill Memo ON 10/09/2020] thiamine injection       LOS: 3 days   Time spent: 35 minutes.  Patrecia Pour, MD [facility] [email or web address] 10/08/2020, 5:35 PM

## 2020-10-08 NOTE — Evaluation (Signed)
Physical Therapy Evaluation/Discharge Patient Details Name: Beverly Dougherty MRN: [id number] DOB: [birth year] Today's Date: 10/08/2020   History of Present Illness  60 y.o. female admitted 8/30 with hyponatremia, AMS, and seizures following viral gastroenteritis. 8/31 MRI (-) for infarction, hemorrhage, or mass. PMH: RA, HTN, and alcohol use  Clinical Impression  Pt tolerated treatment well with VSS on RA. Pt moves very well and is independent with transfers, ambulation, and stairs. Pt dos not require acute or follow up PT and is agreeable to d/c from PT.  HR 86-120    Follow Up Recommendations No PT follow up    Equipment Recommendations  None recommended by PT    Recommendations for Other Services       Precautions / Restrictions Precautions Precautions: None      Mobility  Bed Mobility               General bed mobility comments: pt standing on arrival    Transfers Overall transfer level: Independent               General transfer comment: Sit to stand from bed.  Ambulation/Gait Ambulation/Gait assistance: Independent Gait Distance (Feet): 400 Feet Assistive device: None Gait Pattern/deviations: WFL(Within Functional Limits)   Gait velocity interpretation: >4.37 ft/sec, indicative of normal walking speed    Stairs Stairs: Yes Stairs assistance: Independent Stair Management: One rail Left;No rails;Alternating pattern Number of Stairs: 20 General stair comments: Pt navigated stairs using one rail for first 10 steps and none for additional 10. Independent; no cues required  Wheelchair Mobility    Modified Rankin (Stroke Patients Only)       Balance Overall balance assessment: Independent                                           Pertinent Vitals/Pain Pain Assessment: No/denies pain    Home Living Family/patient expects to be discharged to:: Private residence Living Arrangements: Spouse/significant other Available Help  at Discharge: Family;Available 24 hours/day Type of Home: House Home Access: Stairs to enter Entrance Stairs-Rails: None Entrance Stairs-Number of Steps: 2 Home Layout: Two level Home Equipment: Walker - 2 wheels;Wheelchair - Psychologist, educational;Shower seat;Walker - 4 wheels      Prior Function Level of Independence: Independent               Hand Dominance   Dominant Hand: Right    Extremity/Trunk Assessment   Upper Extremity Assessment Upper Extremity Assessment: Overall WFL for tasks assessed    Lower Extremity Assessment Lower Extremity Assessment: Overall WFL for tasks assessed    Cervical / Trunk Assessment Cervical / Trunk Assessment: Normal  Communication   Communication: No difficulties  Cognition Arousal/Alertness: Awake/alert Behavior During Therapy: WFL for tasks assessed/performed Overall Cognitive Status: Within Functional Limits for tasks assessed                                        General Comments General comments (skin integrity, edema, etc.): VSS on RA; HR 86-120    Exercises     Assessment/Plan    PT Assessment Patent does not need any further PT services  PT Problem List         PT Treatment Interventions      PT Goals (Current goals can be found in the  Care Plan section)       Frequency     Barriers to discharge        Co-evaluation               AM-PAC PT "6 Clicks" Mobility  Outcome Measure Help needed turning from your back to your side while in a flat bed without using bedrails?: None Help needed moving from lying on your back to sitting on the side of a flat bed without using bedrails?: None Help needed moving to and from a bed to a chair (including a wheelchair)?: None Help needed standing up from a chair using your arms (e.g., wheelchair or bedside chair)?: None Help needed to walk in hospital room?: None Help needed climbing 3-5 steps with a railing? : None 6 Click Score: 24     End of Session   Activity Tolerance: Patient tolerated treatment well Patient left: in chair;with call bell/phone within reach;with family/visitor present Nurse Communication: Mobility status PT Visit Diagnosis: Difficulty in walking, not elsewhere classified (R26.2)    Time: [id number] PT Time Calculation (min) (ACUTE ONLY): 17 min   Charges:   PT Evaluation $PT Eval Low Complexity: 1 Low          Vali Capano F, SPT Acute Rehab: [phone] [id number]    Domingo Dimes 10/08/2020, 12:00 PM

## 2020-10-09 LAB — BASIC METABOLIC PANEL
Anion gap: 7 (ref 5–15)
BUN: 8 mg/dL (ref 6–20)
CO2: 22 mmol/L (ref 22–32)
Calcium: 8.6 mg/dL — ABNORMAL LOW (ref 8.9–10.3)
Chloride: 100 mmol/L (ref 98–111)
Creatinine, Ser: 0.94 mg/dL (ref 0.44–1.00)
GFR, Estimated: >60 mL/min (ref >60)
Glucose, Bld: 110 mg/dL — ABNORMAL HIGH (ref 70–99)
Potassium: 4.8 mmol/L (ref 3.5–5.1)
Sodium: 129 mmol/L — ABNORMAL LOW (ref 135–145)

## 2020-10-09 LAB — TSH: TSH: 2.571 u[IU]/mL (ref 0.350–4.500)

## 2020-10-09 MED ORDER — SODIUM CHLORIDE 1 G PO TABS: 1.0000 g | ORAL_TABLET | Freq: Two times a day (BID) | ORAL | 0 refills | Status: DC

## 2020-10-09 MED ORDER — THIAMINE HCL 100 MG PO TABS: 100.0000 mg | ORAL_TABLET | Freq: Every day | ORAL | 0 refills | Status: DC

## 2020-10-09 NOTE — Discharge Summary (Addendum)
Physician Discharge Summary  Beverly Dougherty [id number] DOB: 1960-08-30 DOA: 10/05/2020  PCP: Derinda Late, MD  Admit date: 10/05/2020 Discharge date: 10/09/2020  Admitted From: Home Disposition: Home   Recommendations for Outpatient Follow-up:  Follow up with PCP within the next week with recheck of sodium level. 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 Precipitant not entirely clear. If persistent, patient may be referred to Dr. Gean Quint at [facility] who was curbside consulted in this case.  Follow up with neurology within 6 months (after which time patient's driving restrictions may be lifted if remains seizure-free).   Home Health: None Equipment/Devices: None Discharge Condition: Stable CODE STATUS: Full Diet recommendation: 1500cc fluid restriction.  Brief/Interim Summary: Beverly Dougherty is a 60 y.o. female with a history of HTN on HCTZ, RA on hydroxychloroquine who presented to the ED on PCP's advice due to hyponatremia associated with confusion. This was initially noted on a trip to [place] in the setting of viral gastroenteritis-induced diarrhea requiring hospitalization diarrhea and improved allowing her to fly home. At PCP follow up, Na said to be 110. After arrival to the ED she had a witnessed tonic-clonic seizure, Na 122. Hypertonic saline was given for symptomatic hypernatremia, neurology consulted, initiated cEEG, and patient admitted to ICU. Mental status has improved, and no seizures have been noted on continuous EEG. Sodium level has improved at goal rate after switching to isotonic saline. She was transferred to hospitalist service and Na is 131 on 9/1. With discontinuation of therapies, sodium has crept back down slowly so fluid restriction and salt tabs were instituted with improvement/stability. Na at discharge is 129, asymptomatic.  Discharge Diagnoses:  Active Problems:   Hyponatremia    Altered mental status   Seizure-like activity (HCC)  Tonic-clonic seizure: Provoked by severe hyponatremia.  - Neurology consulted, recommending vitamin supplementation, outpatient follow up, and 6 month driving restriction. Continuous EEG detected no epileptic activity. Will not initiate on AEDs.    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 Appears euvolemic. Urine sodium and osmolality elevated inappropriately. Cortisol sufficient, TSH normal.  - Continue holding thiazide. Institute fluid restriction. Continue modest dose salt tabs.  - Pt has very reliable follow up and will recheck Na at follow up in the next several days. Na at DC 126.   - With suspicion for SIADH in the absence of frequently associated medications, tobacco use history (albeit < 10 pack-years), would consider cross-sectional imaging of the chest if hyponatremia persists given associated with SCLC.  Recent influenza A infection: Was sick as part of a reported outbreak on their cruise ship. This may explain cough for which mucinex is recommended. As above, consider repeat imaging if cough and/or hyponatremia persistent.   Acute metabolic encephalopathy: Due to hyponatremia primarily as mentation has improved considerably with correction. Brain MRI negative.   Alcohol use: Last EtOH said to be 8/15, EtOH was indeed negative on admission and there are no lingering suggestions of alcohol withdrawal. LFTs wnl, RUQ U/S without morphologic evidence of cirrhosis.  - Complete cessation recommended.  - Per neurology: given high dose thiamine while admitted, discharged on '100mg'$  po daily.    Hypomagnesemia:  - Supplement prn   Rheumatoid arthritis: Quiescent.  - Restarted home medications - Follow up per routine with Dr. Amil Amen.   Vulvovaginal candidiasis s/p fluconazole x1   Congestion:  - Mucinex, flonase prn  Discharge Instructions Discharge Instructions  Call MD for:  difficulty breathing, headache or visual disturbances   Complete by: As directed    Call MD for:  persistant dizziness or light-headedness   Complete by: As directed    Call MD for:  persistant nausea and vomiting   Complete by: As directed    Call MD for:  severe uncontrolled pain   Complete by: As directed    Discharge instructions   Complete by: As directed    You were admitted for tonic-clonic seizures due to severe hyponatremia. The cause of hyponatremia was likely due to a condition called SIADH. Information will be attached about this condition. The treatment for you will be fluid restriction (recommendation is 1.5L per day of water) and salt tabs (1g twice daily, sent to your pharmacy). Your sodium level has stabilized but remains low, so you will need to follow up with your PCP early this coming week with repeat blood work. If your sodium level does not improve with these measures, you may follow up with Dr. Candiss Norse at [facility]. Do not take hydrochlorothiazide and do not drink any alcohol.   You should be contacted to schedule a follow up with neurology, but if you aren't contacted, please call the number listed to arrange this. They have recommended continuing thiamine once daily. Per [facility] statutes, patients with seizures are not allowed to drive until they have been seizure-free for six months. Use caution when using heavy equipment or power tools. Avoid working on ladders or at heights. Take showers instead of baths. Ensure the water temperature is not too high on the home water heater. Do not go swimming alone. Do not lock yourself in a room alone (i.e. bathroom). When caring for infants or small children, sit down when holding, feeding, or changing them to minimize risk of injury to the child in the event you have a seizure. Maintain good sleep hygiene. Avoid alcohol.      Allergies as of 10/09/2020   No Known Allergies      Medication List      STOP taking these medications    hydrochlorothiazide 25 MG tablet Commonly known as: HYDRODIURIL   Pennsaid 2 % Soln Generic drug: Diclofenac Sodium       TAKE these medications    acetaminophen 500 MG tablet Commonly known as: TYLENOL Take 1,000 mg by mouth at bedtime.   ALPRAZolam 1 MG tablet Commonly known as: XANAX Take 1 mg by mouth at bedtime.   amphetamine-dextroamphetamine 15 MG 24 hr capsule Commonly known as: ADDERALL XR Take 15 mg by mouth every morning.   cetirizine 10 MG tablet Commonly known as: ZYRTEC Take 10 mg by mouth daily as needed for allergies.   HYDROcodone-acetaminophen 5-325 MG tablet Commonly known as: NORCO/VICODIN Take 1 tablet by mouth 2 (two) times daily as needed for moderate pain or severe pain.   hydroxychloroquine 200 MG tablet Commonly known as: PLAQUENIL Take 100-200 mg by mouth 2 (two) times daily. Take 1 tablet (200 mg) in the morning & Take 1/2 tablet (100 mg) at bedtime   meloxicam 15 MG tablet Commonly known as: MOBIC Take 15 mg by mouth daily.   ondansetron 4 MG disintegrating tablet Commonly known as: ZOFRAN-ODT Take 4 mg by mouth every 12 (twelve) hours as needed for vomiting or nausea.   ProAir Digihaler 108 (90 Base) MCG/ACT Aepb Generic drug: Albuterol Sulfate (sensor) Inhale 2 puffs into the lungs every 6 (six) hours as needed (sob/wheezing).   progesterone 100 MG  capsule Commonly known as: PROMETRIUM Take 100 mg by mouth at bedtime.   sodium chloride 1 g tablet Take 1 tablet (1 g total) by mouth 2 (two) times daily with a meal.   thiamine 100 MG tablet Take 1 tablet (100 mg total) by mouth daily.        Follow-up Information     Derinda Late, MD. Schedule an appointment as soon as possible for a visit in 2 day(s).   Specialty: Family Medicine Why: with repeat labs Contact information: [place] [address] [phone]         Gean Quint, MD Follow up.    Specialty: Nephrology Why: If needed for continued hyponatremia Contact information: [place] [address] [phone]         [facility] Follow up.   Why: for follow up of seizures Contact information: [address]     [place] [phone]               No Known Allergies  Consultations: Neurology PCCM Nephrology (curbside)  Procedures/Studies: DG Chest 2 View  Result Date: 10/05/2020 CLINICAL DATA:  Cough EXAM: CHEST - 2 VIEW COMPARISON:  CTA chest 12/23/2017 FINDINGS: The cardiomediastinal silhouette is normal. The lungs are well inflated. Patchy opacities in the left base on the frontal projection are favored to reflect subsegmental atelectasis. There is no focal consolidation or pulmonary edema. There is no pleural effusion or pneumothorax. There is S-shaped scoliosis of the spine. IMPRESSION: Left basilar subsegmental atelectasis. Otherwise, no radiographic evidence of acute cardiopulmonary process. Electronically Signed   By: Valetta Mole M.D.   On: 10/05/2020 11:19   CT Head Wo Contrast  Result Date: 10/05/2020 CLINICAL DATA:  Altered mental status EXAM: CT HEAD WITHOUT CONTRAST TECHNIQUE: Contiguous axial images were obtained from the base of the skull through the vertex without intravenous contrast. COMPARISON:  Brain MRI 12/03/2013 FINDINGS: Brain: There is no evidence of acute intracranial hemorrhage, extra-axial fluid collection, or infarct. The ventricles are nonenlarged. There is no mass lesion. There is no midline shift. Vascular: No hyperdense vessel or unexpected calcification. Skull: Normal. Negative for fracture or focal lesion. Sinuses/Orbits: The imaged paranasal sinuses are clear. The imaged globes and orbits are unremarkable. Other: The mastoid air cells are clear. IMPRESSION: No acute intracranial pathology. Electronically Signed   By: Valetta Mole M.D.   On: 10/05/2020 11:22    MR BRAIN W WO CONTRAST  Result Date: 10/06/2020 CLINICAL DATA:  Dizziness, nonspecific; episode of seizure EXAM: MRI HEAD WITHOUT AND WITH CONTRAST TECHNIQUE: Multiplanar, multiecho pulse sequences of the brain and surrounding structures were obtained without and with intravenous contrast. CONTRAST:  5.74m GADAVIST GADOBUTROL 1 MMOL/ML IV SOLN COMPARISON:  2015 FINDINGS: Brain: There is no acute infarction or intracranial hemorrhage. There is no intracranial mass, mass effect, or edema. There is no hydrocephalus or extra-axial fluid collection. Ventricles and sulci are within normal limits in size and configuration. No abnormal enhancement. Vascular: Major vessel flow voids at the skull base are preserved. Skull and upper cervical spine: Normal marrow signal is preserved. Sinuses/Orbits: Paranasal sinuses are aerated. Orbits are unremarkable. Other: Sella is unremarkable.  Mastoid air cells are clear. IMPRESSION: No evidence of recent infarction, hemorrhage, or mass. No abnormal enhancement. Electronically Signed   By: PMacy MisM.D.   On: 10/06/2020 15:34   EEG adult  Result Date: 10/05/2020 YLora Havens MD     10/05/2020  7:00  PM Patient Name: WINDI MCFAUL MRN: [id number] Epilepsy Attending: Lora Havens Referring Physician/Provider: Awanda Mink, NP Date: 10/05/2020 Duration: 22.57 mins Patient history: 60 y/o female with a history of alcohol abuse presented today in the setting of confusion and hyponatremia.  She had seizure this morning.  EEG to evaluate for seizure. Level of alertness: Awake AEDs during EEG study: None Technical aspects: This EEG study was done with scalp electrodes positioned according to the 10-20 International system of electrode placement. Electrical activity was acquired at a sampling rate of '500Hz'$  and reviewed with a high frequency filter of '70Hz'$  and a low frequency filter of '1Hz'$ . EEG data were recorded continuously and digitally stored. Description: The  posterior dominant rhythm consists of 9.5 Hz activity of moderate voltage (25-35 uV) seen predominantly in posterior head regions, symmetric and reactive to eye opening and eye closing. Hyperventilation and photic stimulation were not performed.   IMPRESSION: This study is within normal limits. No seizures or epileptiform discharges were seen throughout the recording. Priyanka Barbra Sarks   Overnight EEG with video  Result Date: 10/07/2020 Lora Havens, MD     10/08/2020 11:38 AM Patient Name: Beverly Dougherty MRN: [id number] Epilepsy Attending: Lora Havens Referring Physician/Provider: Dr Su Monks Duration: 10/06/2020 1642 to 10/07/2020 1058  Patient history: 60 y/o female with a history of alcohol abuse presented today in the setting of confusion and hyponatremia.  She had seizure this morning.  EEG to evaluate for seizure.  Level of alertness: Awake, asleep  AEDs during EEG study: None  Technical aspects: This EEG study was done with scalp electrodes positioned according to the 10-20 International system of electrode placement. Electrical activity was acquired at a sampling rate of '500Hz'$  and reviewed with a high frequency filter of '70Hz'$  and a low frequency filter of '1Hz'$ . EEG data were recorded continuously and digitally stored.  Description: The posterior dominant rhythm consists of 9.5 Hz activity of moderate voltage (25-35 uV) seen predominantly in posterior head regions, symmetric and reactive to eye opening and eye closing. Sleep was characterized by vertex waves, sleep spindles (12 to 14 Hz), maximum frontocentral region. Hyperventilation and photic stimulation were not performed.   Event button was pressed on 09/18/2020 at [place], [place], 2054, 2235 and 2316. Patient reported losing time and having a hard time finding her words. Concomitant EEG before, during and after the event did not show any EEG changes suggest seizure.  IMPRESSION: This study is within normal limits. No seizures or epileptiform  discharges were seen throughout the recording. Multiple events were recorded during which patient speech disturbance as described above without concomitant EEG change.  These episodes were most likely not epileptic.  Priyanka Barbra Sarks   US Abdomen Limited RUQ (LIVER/GB)  Result Date: 10/05/2020 CLINICAL DATA:  Pain EXAM: ULTRASOUND ABDOMEN LIMITED RIGHT UPPER QUADRANT COMPARISON:  None. FINDINGS: Gallbladder: No gallstones or wall thickening visualized. No sonographic Murphy sign noted by sonographer. Common bile duct: Diameter: 1.3 Liver: No focal lesion identified. Increased parenchymal echogenicity. Portal vein is patent on color Doppler imaging with normal direction of blood flow towards the liver. Other: None. IMPRESSION: Normal sonographic appearance of the gallbladder. Hepatic steatosis. Electronically Signed   By: Yetta Glassman M.D.   On: 10/05/2020 16:07     Subjective: Feels well, ambulating without difficulty. Wants to go home.   Discharge Exam: Vitals:   10/09/20 0331 10/09/20 0808  BP: (!) 126/97 (!) 144/81  Pulse: 77 81  Resp: 16 18  Temp: 98.3 F (36.8 C) (!) 97.5 F (36.4 C)  SpO2: 98% 100%   General: Pt is alert, awake, not in acute distress Cardiovascular: RRR, S1/S2 +, no rubs, no gallops Respiratory: CTA bilaterally, no wheezing, no rhonchi Abdominal: Soft, NT, ND, bowel sounds + Extremities: No edema, no cyanosis  Labs: BNP (last 3 results) No results for input(s): BNP in the last 8760 hours. Basic Metabolic Panel: Recent Labs  Lab 10/05/20 0930 10/05/20 1811 10/06/20 0408 10/06/20 0800 10/06/20 1840 10/07/20 0742 10/07/20 1859 10/08/20 0803 10/09/20 0118  NA 122*   < > 127*   < > 128* 131* 129* 128* 129*  K 4.1  --  3.9  --  3.5 4.8 5.1 4.8 4.8  CL 92*  --  97*  --  96* 104 100 99 100  CO2 22  --  19*  --  20* 20* '24 22 22  '$ GLUCOSE 110*  --  82  --  145* 86 99 102* 110*  BUN 6  --  5*  --  5* <5* <5* <5* 8  CREATININE 0.81  --  0.76  --  1.09*  0.89 0.93 0.89 0.94  CALCIUM 8.9  --  8.6*  --  8.5* 8.2* 8.6* 8.6* 8.6*  MG 1.8  --  2.3  --   --   --   --   --   --   PHOS  --   --  3.8  --   --   --   --   --   --    < > = values in this interval not displayed.   Liver Function Tests: Recent Labs  Lab 10/05/20 0930  AST 31  ALT 31  ALKPHOS 55  BILITOT 0.8  PROT 6.1*  ALBUMIN 3.6   No results for input(s): LIPASE, AMYLASE in the last 168 hours. Recent Labs  Lab 10/05/20 0930  AMMONIA 12   CBC: Recent Labs  Lab 10/05/20 0930 10/06/20 0408 10/07/20 0742  WBC 4.8 6.3 5.1  HGB 10.3* 10.7* 10.1*  HCT 29.8* 31.6* 30.3*  MCV 95.5 96.6 98.4  PLT 401* 419* 470*   Cardiac Enzymes: No results for input(s): CKTOTAL, CKMB, CKMBINDEX, TROPONINI in the last 168 hours. BNP: Invalid input(s): POCBNP CBG: Recent Labs  Lab 10/05/20 1010 10/06/20 1955 10/06/20 2353 10/07/20 0340  GLUCAP 111* 132* 112* 103*   D-Dimer No results for input(s): DDIMER in the last 72 hours. Hgb A1c No results for input(s): HGBA1C in the last 72 hours. Lipid Profile No results for input(s): CHOL, HDL, LDLCALC, TRIG, CHOLHDL, LDLDIRECT in the last 72 hours. Thyroid function studies Recent Labs    10/09/20 0118  TSH 2.571   Anemia work up No results for input(s): VITAMINB12, FOLATE, FERRITIN, TIBC, IRON, RETICCTPCT in the last 72 hours. Urinalysis    Component Value Date/Time   COLORURINE YELLOW 04/17/2015 2130   APPEARANCEUR CLOUDY (A) 04/17/2015 2130   LABSPEC 1.005 04/17/2015 2130   PHURINE 5.5 04/17/2015 2130   GLUCOSEU NEGATIVE 04/17/2015 2130   HGBUR NEGATIVE 04/17/2015 2130   [place] NEGATIVE 04/17/2015 2130   [place] NEGATIVE 04/17/2015 2130   PROTEINUR NEGATIVE 04/17/2015 2130   NITRITE NEGATIVE 04/17/2015 2130   LEUKOCYTESUR NEGATIVE 04/17/2015 2130    Microbiology Recent Results (from the past 240 hour(s))  Resp Panel by RT-PCR (Flu A&B, Covid) Nasopharyngeal Swab     Status: None   Collection Time: 10/05/20  11:09 AM   Specimen: Nasopharyngeal Swab; Nasopharyngeal(NP) swabs in  vial transport medium  Result Value Ref Range Status   SARS Coronavirus 2 by RT PCR NEGATIVE NEGATIVE Final    Comment: (NOTE) SARS-CoV-2 target nucleic acids are NOT DETECTED.  The SARS-CoV-2 RNA is generally detectable in upper respiratory specimens during the acute phase of infection. The lowest concentration of SARS-CoV-2 viral copies this assay can detect is 138 copies/mL. A negative result does not preclude SARS-Cov-2 infection and should not be used as the sole basis for treatment or other patient management decisions. A negative result may occur with  improper specimen collection/handling, submission of specimen other than nasopharyngeal swab, presence of viral mutation(s) within the areas targeted by this assay, and inadequate number of viral copies(<138 copies/mL). A negative result must be combined with clinical observations, patient history, and epidemiological information. The expected result is Negative.  Fact Sheet for Patients:  EntrepreneurPulse.com.au  Fact Sheet for Healthcare Providers:  IncredibleEmployment.be  This test is no t yet approved or cleared by the [place] FDA and  has been authorized for detection and/or diagnosis of SARS-CoV-2 by FDA under an Emergency Use Authorization (EUA). This EUA will remain  in effect (meaning this test can be used) for the duration of the COVID-19 declaration under Section 564(b)(1) of the Act, 21 U.S.C.section 360bbb-3(b)(1), unless the authorization is terminated  or revoked sooner.       Influenza A by PCR NEGATIVE NEGATIVE Final   Influenza B by PCR NEGATIVE NEGATIVE Final    Comment: (NOTE) The Xpert Xpress SARS-CoV-2/FLU/RSV plus assay is intended as an aid in the diagnosis of influenza from Nasopharyngeal swab specimens and should not be used as a sole basis for treatment. Nasal washings and aspirates  are unacceptable for Xpert Xpress SARS-CoV-2/FLU/RSV testing.  Fact Sheet for Patients: EntrepreneurPulse.com.au  Fact Sheet for Healthcare Providers: IncredibleEmployment.be  This test is not yet approved or cleared by the [place] FDA and has been authorized for detection and/or diagnosis of SARS-CoV-2 by FDA under an Emergency Use Authorization (EUA). This EUA will remain in effect (meaning this test can be used) for the duration of the COVID-19 declaration under Section 564(b)(1) of the Act, 21 U.S.C. section 360bbb-3(b)(1), unless the authorization is terminated or revoked.  Performed at [facility], [place] [address] [place], [place] [address]   MRSA Next Gen by PCR, Nasal     Status: None   Collection Time: 10/05/20  8:34 PM   Specimen: Nasal Mucosa; Nasal Swab  Result Value Ref Range Status   MRSA by PCR Next Gen NOT DETECTED NOT DETECTED Final    Comment: (NOTE) The GeneXpert MRSA Assay (FDA approved for NASAL specimens only), is one component of a comprehensive MRSA colonization surveillance program. It is not intended to diagnose MRSA infection nor to guide or monitor treatment for MRSA infections. Test performance is not FDA approved in patients less than 35 years old. Performed at [facility], [place] [address], [place], [place] [address]     Time coordinating discharge: Approximately 40 minutes  Patrecia Pour, MD  [facility] 10/09/2020, 6:25 PM

## 2020-10-13 ENCOUNTER — Other Ambulatory Visit: Payer: Self-pay

## 2020-10-13 ENCOUNTER — Other Ambulatory Visit: Payer: Self-pay | Admitting: Family Medicine

## 2020-10-13 ENCOUNTER — Ambulatory Visit
Admission: RE | Admit: 2020-10-13 | Discharge: 2020-10-13 | Disposition: A | Discharge status: Home / Self Care | Payer: No Typology Code available for payment source | Source: Ambulatory Visit | Attending: Family Medicine | Admitting: Family Medicine

## 2020-10-13 DIAGNOSIS — R0989 Other specified symptoms and signs involving the circulatory and respiratory systems: Secondary | ICD-10-CM

## 2020-10-20 ENCOUNTER — Encounter: Payer: Self-pay | Admitting: Neurology

## 2020-10-26 ENCOUNTER — Other Ambulatory Visit: Payer: Self-pay | Admitting: Family Medicine

## 2020-10-26 ENCOUNTER — Other Ambulatory Visit: Payer: Self-pay

## 2020-10-26 ENCOUNTER — Ambulatory Visit
Admission: RE | Admit: 2020-10-26 | Discharge: 2020-10-26 | Disposition: A | Discharge status: Home / Self Care | Payer: No Typology Code available for payment source | Source: Ambulatory Visit | Attending: Family Medicine | Admitting: Family Medicine

## 2020-10-26 DIAGNOSIS — M545 Low back pain, unspecified: Secondary | ICD-10-CM

## 2020-10-26 DIAGNOSIS — S5012XA Contusion of left forearm, initial encounter: Secondary | ICD-10-CM

## 2020-11-01 ENCOUNTER — Encounter: Payer: Self-pay | Admitting: Neurology

## 2020-11-01 ENCOUNTER — Ambulatory Visit (INDEPENDENT_AMBULATORY_CARE_PROVIDER_SITE_OTHER): Payer: No Typology Code available for payment source | Admitting: Neurology

## 2020-11-01 ENCOUNTER — Other Ambulatory Visit: Payer: Self-pay

## 2020-11-01 VITALS — BP 171/92 | HR 61 | Resp 18 | Ht 63.0 in | Wt 131.0 lb

## 2020-11-01 DIAGNOSIS — R569 Unspecified convulsions: Secondary | ICD-10-CM | POA: Diagnosis not present

## 2020-11-01 DIAGNOSIS — H539 Unspecified visual disturbance: Secondary | ICD-10-CM

## 2020-11-01 DIAGNOSIS — E871 Hypo-osmolality and hyponatremia: Secondary | ICD-10-CM | POA: Diagnosis not present

## 2020-11-01 NOTE — Patient Instructions (Addendum)
Good to meet you!  Schedule MRI brain with and without contrast  2. Continue follow-up with your nephrologist and eye doctor  3. Follow-up in 4 months, call for any changes   Seizure Precautions: 1. If medication has been prescribed for you to prevent seizures, take it exactly as directed.  Do not stop taking the medicine without talking to your doctor first, even if you have not had a seizure in a long time.   2. Avoid activities in which a seizure would cause danger to yourself or to others.  Don't operate dangerous machinery, swim alone, or climb in high or dangerous places, such as on ladders, roofs, or girders.  Do not drive unless your doctor says you may.  3. If you have any warning that you may have a seizure, lay down in a safe place where you can't hurt yourself.    4.  No driving for 6 months from last seizure, as per [facility].   Please refer to the following link on the [place] website for more information: http://www.epilepsyfoundation.org/answerplace/Social/driving/drivingu.cfm   5.  Maintain good sleep hygiene. Avoid alcohol.  6.  Contact your doctor if you have any problems that may be related to the medicine you are taking.  7.  Call 911 and bring the patient back to the ED if:        A.  The seizure lasts longer than 5 minutes.       B.  The patient doesn't awaken shortly after the seizure  C.  The patient has new problems such as difficulty seeing, speaking or moving  D.  The patient was injured during the seizure  E.  The patient has a temperature over 102 F (39C)  F.  The patient vomited and now is having trouble breathing

## 2020-11-01 NOTE — Progress Notes (Signed)
NEUROLOGY CONSULTATION NOTE  Beverly Dougherty MRN: [id number] DOB: [birth year]  Referring provider: Dr. Derinda Late Primary care provider: Dr. Derinda Late  Reason for consult:  seizure  Dear Dr Sandi Mariscal:  Thank you for your kind referral of [name] for consultation of the above symptoms. Although her history is well known to you, please allow me to reiterate it for the purpose of our medical record. The patient was accompanied to the clinic by her mother who also provides collateral information. Records and images were personally reviewed where available.  HISTORY OF PRESENT ILLNESS: This is a pleasant 60 year old right-handed woman with a history of ADHD, presenting for evaluation of seizure. Records were reviewed. She had been travelling in [place] and got sick with viral enteritis, staying in bed for 2 days in [place]. She kept herself hydrated and flew to [place], where she started having vomiting and diarrhea. She continued taking electrolyte drinks then recalls getting off the bus and her equilibrium was gone, "eyes going 2 different directions." She was driven to a hospital 1.5 hours away ([place]) where HCTZ was stopped. Notes indicate her sodium level was 111, she wanted to go back to the [place] despite their advice to stay, Na on discharge was 122. She flew to the [place] on 8/27 with plans for bloodwork with her PCP the next day. She was up for over 24 hours and recalls that when she got home, she was erratic. She could not unpack and focus on one thing. She recalls apologizing and repeating herself. On 8/30, confusion got worse, her husband called EMS and she recalls seeing them briefly then waking up in the ICU with blood all over her clothes from biting her lip. She recalls talking to her mother and "getting trapped in my body," calling the nurse. Another time she was calling staff saying something repeatedly and could not get her words out. Per notes, there was  seizure-like activity on EMS arrival. She was brought to [facility] where Na was 122. CT head no acute changes. In the ER, she had a witnessed 30-second GTC and quickly returned to baseline. MRI brain with and without contrast on 10/06/20 no acute changes. She had an EEG which was normal. Prolonged EEG showed multiple episodes where she reported speech disturbance, with no EEG correlate seen. Sodium level on hospital discharge on 10/09/20 was 129. She reports her most recent Na level with PCP was 131.  Hospital records indicate she would drink approximately 1 bottle of wine per day but had stopped drinking in preparation for her trip to [place]. Last alcohol was 2 weeks prior to event. She does not take alprazolam regularly, has only taken it once recently. She does not take hydrocodone regularly. She was discharged home on thiamine. She also takes testosterone and estrogen pellets. While in the hospital, she felt like she was in a fog with tinnitus, around 6pm she still gets a little fog but cognitively she feels back to normal, she feels great, active. Initially she had a hard time talking and retrieving words, this is much better now. Spelling was also affected, but improved. Her main issue is vision change, "I can't see," event with her glasses. She recalls briefly having white flashes on both sides of her eyes. Sleep is good. Her mother notes that "she's extremely chatty," which is new. She states "I'm still hyper." She denies any prior history of seizures. She and her mother deny any staring/unresponsive episodes, other gaps in  time, olfactory/gustatory hallucinations, deja vu, rising epigastric sensation, focal numbness/tingling/weakness, myoclonic jerks. She had a normal birth and early development.  There is no history of febrile convulsions, CNS infections such as meningitis/encephalitis, significant traumatic brain injury, neurosurgical procedures, or family history of seizures.    PAST MEDICAL HISTORY: Past  Medical History:  Diagnosis Date   Insomnia    related to pain from osteoarthritis   Osteoarthritis    bilat thumbs and hips    PAST SURGICAL HISTORY: Past Surgical History:  Procedure Laterality Date   BREAST ENHANCEMENT SURGERY     CERVICAL FUSION     HERNIA REPAIR     TONSILLECTOMY     URETHRAL FISTULA REPAIR     after childbirth   WISDOM TOOTH EXTRACTION      MEDICATIONS: Current Outpatient Medications on File Prior to Visit  Medication Sig Dispense Refill   acetaminophen (TYLENOL) 500 MG tablet Take 1,000 mg by mouth at bedtime.     Albuterol Sulfate, sensor, (PROAIR DIGIHALER) 108 (90 Base) MCG/ACT AEPB Inhale 2 puffs into the lungs every 6 (six) hours as needed (sob/wheezing).     ALPRAZolam (XANAX) 0.5 MG tablet Take 0.5 mg by mouth 2 (two) times daily as needed.     ALPRAZolam (XANAX) 1 MG tablet Take 1 mg by mouth at bedtime.     amphetamine-dextroamphetamine (ADDERALL XR) 15 MG 24 hr capsule Take 15 mg by mouth every morning.     HYDROcodone-acetaminophen (NORCO/VICODIN) 5-325 MG per tablet Take 1 tablet by mouth 2 (two) times daily as needed for moderate pain or severe pain.     hydroxychloroquine (PLAQUENIL) 200 MG tablet Take 100-200 mg by mouth 2 (two) times daily. Take 1 tablet (200 mg) in the morning & Take 1/2 tablet (100 mg) at bedtime     meloxicam (MOBIC) 15 MG tablet Take 15 mg by mouth daily.     ondansetron (ZOFRAN-ODT) 4 MG disintegrating tablet Take 4 mg by mouth every 12 (twelve) hours as needed for vomiting or nausea.     progesterone (PROMETRIUM) 100 MG capsule Take 100 mg by mouth at bedtime.     sodium chloride 1 g tablet Take 1 tablet (1 g total) by mouth 2 (two) times daily with a meal. 60 tablet 0   thiamine 100 MG tablet Take 1 tablet (100 mg total) by mouth daily. 30 tablet 0   cetirizine (ZYRTEC) 10 MG tablet Take 10 mg by mouth daily as needed for allergies.     No current facility-administered medications on file prior to visit.     ALLERGIES: Allergies  Allergen Reactions   Other     FAMILY HISTORY: Family History  Problem Relation Age of Onset   Colon cancer Neg Hx     SOCIAL HISTORY: Social History   Socioeconomic History   Marital status: Married    Spouse name: Not on file   Number of children: Not on file   Years of education: Not on file   Highest education level: Not on file  Occupational History   Not on file  Tobacco Use   Smoking status: Former    Packs/day: 0.50    Years: 5.00    Pack years: 2.50    Types: Cigarettes    Quit date: 09/25/1984    Years since quitting: 36.1   Smokeless tobacco: Never  Substance and Sexual Activity   Alcohol use: Yes    Comment: socially   Drug use: No   Sexual activity: Not on  file  Other Topics Concern   Not on file  Social History Narrative   Not on file   Social Determinants of Health   Financial Resource Strain: Not on file  Food Insecurity: Not on file  Transportation Needs: Not on file  Physical Activity: Not on file  Stress: Not on file  Social Connections: Not on file  Intimate Partner Violence: Not on file     PHYSICAL EXAM: Vitals:   11/01/20 1021  BP: (!) 171/92  Pulse: 61  Resp: 18  SpO2: 98%   General: No acute distress Head:  Normocephalic/atraumatic Skin/Extremities: No rash, no edema Neurological Exam: Mental status: alert and oriented to person, place, and time, no dysarthria or aphasia, Fund of knowledge is appropriate.  Recent and remote memory are intact.  Attention and concentration are normal.     Cranial nerves: CN I: not tested CN II: pupils equal, round and reactive to light, visual fields intact CN III, IV, VI:  full range of motion, no nystagmus, no ptosis CN V: facial sensation intact CN VII: upper and lower face symmetric CN VIII: hearing intact to conversation Bulk & Tone: normal, no fasciculations. Motor: 5/5 throughout with no pronator drift. Sensation: intact to light touch, cold, pin,  vibration sense.  No extinction to double simultaneous stimulation.  Romberg test negative Deep Tendon Reflexes: +2 throughout Cerebellar: no incoordination on finger to nose testing Gait: narrow-based and steady, able to tandem walk adequately. Tremor: none   IMPRESSION: This is a pleasant 60 year old right-handed woman with a history of ADHD, presenting for evaluation of new onset seizures that occurred in the setting of hyponatremia, sleep deprivation. She had a prolonged EEG which was normal. No clear epilepsy risk factors, neurological exam today normal. She notes a change in her vision since the event in 09/2020, MRI brain with and without contrast will be ordered to assess for underlying structural abnormality. Continue follow-up with ophthalmology and nephrology. We discussed [place] driving laws, since seizure was provoked, she does not need to stop driving for 6 months, but would ensure vision is better and MRI no acute changes before resuming driving. Follow-up in 4 months, call for any changes.    Thank you for allowing me to participate in the care of this patient. Please do not hesitate to call for any questions or concerns.   Ellouise Newer, M.D.  CC: Dr. Sandi Mariscal

## 2020-11-10 ENCOUNTER — Telehealth: Payer: Self-pay | Admitting: Neurology

## 2020-11-10 NOTE — Telephone Encounter (Signed)
Patient called and left a message requesting a call back about her MRI.   She said she hasn't heard from anyone yet for scheduling and she is anxious to proceed with the testing.

## 2020-11-10 NOTE — Telephone Encounter (Signed)
Sent message to [facility] at 3:17 11/10/2020 awaiting call back from [facility]

## 2020-11-11 NOTE — Telephone Encounter (Signed)
[phone] number given to [facility].

## 2020-11-22 ENCOUNTER — Ambulatory Visit
Admission: RE | Admit: 2020-11-22 | Discharge: 2020-11-22 | Disposition: A | Discharge status: Home / Self Care | Payer: No Typology Code available for payment source | Source: Ambulatory Visit | Attending: Neurology | Admitting: Neurology

## 2020-11-22 DIAGNOSIS — R569 Unspecified convulsions: Secondary | ICD-10-CM

## 2020-11-22 DIAGNOSIS — E871 Hypo-osmolality and hyponatremia: Secondary | ICD-10-CM

## 2020-11-22 DIAGNOSIS — H539 Unspecified visual disturbance: Secondary | ICD-10-CM

## 2020-11-22 MED ORDER — GADOBENATE DIMEGLUMINE 529 MG/ML IV SOLN
12.0000 mL | Freq: Once | INTRAVENOUS | Status: AC | PRN
  Administered 2020-11-22: 12 mL via INTRAVENOUS

## 2020-11-25 ENCOUNTER — Telehealth: Payer: Self-pay

## 2020-11-25 NOTE — Telephone Encounter (Signed)
Pt called no answer per DPR left a voice mail brain MRI looks great, no tumor, stroke, or bleed. No changes in the brain to cause her vision changes, follow-up with eye doctor

## 2020-11-25 NOTE — Telephone Encounter (Signed)
-----   Message from Cameron Sprang, MD sent at 11/23/2020  4:28 PM EDT ----- Pls let her know brain MRI looks great, no tumor, stroke, or bleed. No changes in the brain to cause her vision changes, follow-up with eye doctor. Thanks

## 2020-11-29 ENCOUNTER — Telehealth: Payer: Self-pay | Admitting: Neurology

## 2020-11-29 NOTE — Telephone Encounter (Signed)
Patient returned call to Heather. 

## 2020-11-29 NOTE — Telephone Encounter (Signed)
Left a voice mail for pt to call the office back

## 2020-11-29 NOTE — Telephone Encounter (Signed)
How is her vision? If vision changes still present, would recommend she follow-up with eye doctor and have them clear her for driving, but from seizure standpoint, she can drive. Was she in any accidents? Who is the letter for? Thanks

## 2020-11-29 NOTE — Telephone Encounter (Signed)
Pt is calling to see if aquino is going to write a letter so she is able to drive since her results came back good.

## 2020-11-29 NOTE — Telephone Encounter (Signed)
Her records from [facility], vision is good, are in the Media, she does not need a letter, pt informed that she has been cleared to drive by Dr Delice Lesch.

## 2021-01-19 ENCOUNTER — Ambulatory Visit: Payer: Self-pay

## 2021-01-19 ENCOUNTER — Other Ambulatory Visit: Payer: Self-pay

## 2021-01-19 ENCOUNTER — Ambulatory Visit (INDEPENDENT_AMBULATORY_CARE_PROVIDER_SITE_OTHER): Payer: No Typology Code available for payment source | Admitting: Family Medicine

## 2021-01-19 ENCOUNTER — Ambulatory Visit (INDEPENDENT_AMBULATORY_CARE_PROVIDER_SITE_OTHER): Payer: No Typology Code available for payment source

## 2021-01-19 VITALS — BP 152/100 | HR 84 | Ht 63.0 in | Wt 134.6 lb

## 2021-01-19 DIAGNOSIS — M25511 Pain in right shoulder: Secondary | ICD-10-CM

## 2021-01-19 DIAGNOSIS — M25512 Pain in left shoulder: Secondary | ICD-10-CM

## 2021-01-19 DIAGNOSIS — M059 Rheumatoid arthritis with rheumatoid factor, unspecified: Secondary | ICD-10-CM

## 2021-01-19 NOTE — Patient Instructions (Addendum)
Thank you for coming in today.   You received steroid injections today. Seek immediate medical attention if the joint becomes red, extremely painful, or is oozing fluid.   I've referred you to Physical Therapy.  Let us know if you don't hear from them in one week.   Please get an Xray today before you leave   Recheck in about 6-8 weeks.

## 2021-01-19 NOTE — Progress Notes (Signed)
Note duplication 

## 2021-01-19 NOTE — Progress Notes (Signed)
I, [name], LAT, ATC acting as a scribe for [name], MD.  Beverly Dougherty is a 60 y.o. female who presents to [place] at [facility] today for bilat shoulder pain. Pt was previously seen by Dr. [name] on 09/08/20 for her R shoulder and was given a R [place] steroid injection. Today, pt reports bilat shoulders have been very painful over the past 4 weeks. Pt c/o increased pain when trying to lay on her side to sleep. Pt notes decreased AROM esp w/ IR and ABD.  Since the last visit she had a provoked seizure secondary to hyponatremia due to gastroenteritis and dehydration.  She is followed up with her neurologist and effectively been cleared.  As for her RA she has a follow-up appointment with Dr. Amil Amen January 14.  Dx imaging: 11/21/16 R shoulder & c-spine XR  Pertinent review of systems: No fevers or chills  Relevant historical information: Rheumatoid arthritis   Exam:  BP (!) 152/100    Pulse 84    Ht 5\' 3"  (1.6 m)    Wt 134 lb 9.6 oz (61.1 kg)    LMP 03/23/2015    SpO2 98%    BMI 23.84 kg/m  General: Well Developed, well nourished, and in no acute distress.   MSK: Right shoulder: Normal-appearing Nontender. Range of motion abduction 130 degrees.  Functional internal rotation lumbar spine.  External rotation full. Strength intact pain with abduction. Positive Hawkins and Neer's test. Negative Yergason's and speeds test.  Left shoulder: Normal-appearing Nontender. Range of motion abduction 130 degrees.  Functional internal rotation lower portion of lumbar spine or posterior iliac crest. External rotation is full. Strength intact however pain is present with abduction. Positive Hawkins and Neer's test. Negative Yergason's and speeds test.    Lab and Radiology Results  Procedure: Real-time Ultrasound Guided Injection of left shoulder subacromial bursa Device: Philips Affiniti 50G Images permanently stored and available for review in  PACS Ultrasound evaluation prior to injection reveals large hypoechoic fluid in biceps tendon sheath with intact biceps tendon.  Moderate subacromial bursitis.  Intact rotator cuff tendons. Verbal informed consent obtained.  Discussed risks and benefits of procedure. Warned about infection bleeding damage to structures skin hypopigmentation and fat atrophy among others. Patient expresses understanding and agreement Time-out conducted.   Noted no overlying erythema, induration, or other signs of local infection.   Skin prepped in a sterile fashion.   Local anesthesia: Topical Ethyl chloride.   With sterile technique and under real time ultrasound guidance: 40 mg of Kenalog and 2 mL of Marcaine injected into subacromial bursa. Fluid seen entering the bursa.   Completed without difficulty   Pain moderately  resolved suggesting accurate placement of the medication.   Advised to call if fevers/chills, erythema, induration, drainage, or persistent bleeding.   Images permanently stored and available for review in the ultrasound unit.  Impression: Technically successful ultrasound guided injection.    Procedure: Real-time Ultrasound Guided Injection of right shoulder subacromial bursa Device: Philips Affiniti 50G Images permanently stored and available for review in PACS Ultrasound evaluation prior to injection reveals mild hypoechoic fluid in biceps tendon sheath with intact biceps tendon.  Intact rotator cuff tendons.  Mild subacromial bursitis. Verbal informed consent obtained.  Discussed risks and benefits of procedure. Warned about infection bleeding damage to structures skin hypopigmentation and fat atrophy among others. Patient expresses understanding and agreement Time-out conducted.   Noted no overlying erythema, induration, or other signs of local infection.  Skin prepped in a sterile fashion.   Local anesthesia: Topical Ethyl chloride.   With sterile technique and under real time  ultrasound guidance: 40 mg of Kenalog and 2 mL of Marcaine injected into subacromial bursa. Fluid seen entering the bursa.   Completed without difficulty   Pain immediately resolved suggesting accurate placement of the medication.   Advised to call if fevers/chills, erythema, induration, drainage, or persistent bleeding.   Images permanently stored and available for review in the ultrasound unit.  Impression: Technically successful ultrasound guided injection.    X-ray images bilateral shoulders obtained today personally and independently interpreted  Right shoulder: Mild glenohumeral DJD.  Minimal AC DJD.  No acute fractures.  Left shoulder: Moderate glenohumeral DJD.  AC DJD present.  No acute fractures.  Await formal radiology review    Assessment and Plan: 60 y.o. female with bilateral shoulder pain left is worse than right.  Pain thought primarily due to subacromial bursitis.  Given her RA some of the pain may be intra-articular in nature.  She does have hypoechoic fluid seen within the biceps tendon sheath but does not have physical exam signs consistent with biceps tendinitis indicating the tendon sheath fluid is originating from intra-articular cause which does indicate an intra-articular source of inflammation. Plan to treat with subacromial injection and physical therapy referral.  If not improved would recommend glenohumeral injection in about a month with her rheumatologist.  Recheck back with me in 6 to 8 weeks.   PDMP not reviewed this encounter. Orders Placed This Encounter  Procedures   [place] LIMITED JOINT SPACE STRUCTURES UP BILAT(NO LINKED CHARGES)    Standing Status:   Future    Number of Occurrences:   1    Standing Expiration Date:   07/20/2021    Order Specific Question:   Reason for Exam (SYMPTOM  OR DIAGNOSIS REQUIRED)    Answer:   bilateral shoulder pain    Order Specific Question:   Preferred imaging location?    Answer:   [place]    DG Shoulder Right    Standing Status:   Future    Number of Occurrences:   1    Standing Expiration Date:   01/19/2022    Order Specific Question:   Reason for Exam (SYMPTOM  OR DIAGNOSIS REQUIRED)    Answer:   eval shoulder pain    Order Specific Question:   Is patient pregnant?    Answer:   No    Order Specific Question:   Preferred imaging location?    Answer:   [facility]   DG Shoulder Left    Standing Status:   Future    Number of Occurrences:   1    Standing Expiration Date:   01/19/2022    Order Specific Question:   Reason for Exam (SYMPTOM  OR DIAGNOSIS REQUIRED)    Answer:   eval shoulder pain    Order Specific Question:   Is patient pregnant?    Answer:   No    Order Specific Question:   Preferred imaging location?    Answer:   [facility]   Ambulatory referral to Physical Therapy    Referral Priority:   Routine    Referral Type:   Physical Medicine    Referral Reason:   Specialty Services Required    Requested Specialty:   Physical Therapy    Number of Visits Requested:   1   No orders of the defined types were  placed in this encounter.    Discussed warning signs or symptoms. Please see discharge instructions. Patient expresses understanding.   The above documentation has been reviewed and is accurate and complete [name], M.D.

## 2021-01-24 ENCOUNTER — Telehealth: Payer: Self-pay | Admitting: Family Medicine

## 2021-01-24 NOTE — Telephone Encounter (Signed)
Pt had bil shoulder injections last week. Still struggling with L shoulder, can she get another injection in the L shoulder, maybe from another angle? She does not start PT til the end of the month and is still struggling with the L shoulder.

## 2021-01-26 NOTE — Progress Notes (Signed)
Left shoulder x-ray shows significant left shoulder arthritis

## 2021-01-26 NOTE — Telephone Encounter (Signed)
Yes. She can come in for an inejction tomorrow. I have openings. We can do a subacromial injection.   Ellard Artis

## 2021-01-26 NOTE — Progress Notes (Signed)
Right shoulder x-ray shows worsening right shoulder arthritis.

## 2021-01-26 NOTE — Progress Notes (Signed)
Beverly Dougherty presents to clinic today for previously arranged glenohumeral injection left shoulder.  Injection due to discovered glenohumeral DJD.  Patient had subacromial injection on January 19, 2021 which was not very helpful.  Procedure: Real-time Ultrasound Guided Injection of left shoulder glenohumeral joint Device: Philips Affiniti 50G Images permanently stored and available for review in PACS Verbal informed consent obtained.  Discussed risks and benefits of procedure. Warned about infection bleeding damage to structures skin hypopigmentation and fat atrophy among others. Patient expresses understanding and agreement Time-out conducted.   Noted no overlying erythema, induration, or other signs of local infection.   Skin prepped in a sterile fashion.   Local anesthesia: Topical Ethyl chloride.   With sterile technique and under real time ultrasound guidance: 40 mg of Kenalog and 2 mL of Marcaine injected into glenohumeral joint joint. Fluid seen entering the joint capsule.   Completed without difficulty   Pain immediately resolved suggesting accurate placement of the medication.   Advised to call if fevers/chills, erythema, induration, drainage, or persistent bleeding.   Images permanently stored and available for review in the ultrasound unit.  Impression: Technically successful ultrasound guided injection.   Return as needed.  CC: Dr Amil Amen

## 2021-01-27 ENCOUNTER — Encounter: Payer: Self-pay | Admitting: Family Medicine

## 2021-01-27 ENCOUNTER — Other Ambulatory Visit: Payer: Self-pay

## 2021-01-27 ENCOUNTER — Ambulatory Visit: Payer: Self-pay

## 2021-01-27 ENCOUNTER — Ambulatory Visit (INDEPENDENT_AMBULATORY_CARE_PROVIDER_SITE_OTHER): Payer: No Typology Code available for payment source | Admitting: Family Medicine

## 2021-01-27 VITALS — BP 130/84 | HR 51 | Ht 63.0 in | Wt 134.6 lb

## 2021-01-27 DIAGNOSIS — M19019 Primary osteoarthritis, unspecified shoulder: Secondary | ICD-10-CM

## 2021-01-27 NOTE — Patient Instructions (Signed)
Good to see you.  You had  a L shoulder joint injection.  Call or go to the ER if you develop a large red swollen joint with extreme pain or oozing puss.   Follow-up as needed.  Happy Holidays!

## 2021-02-04 ENCOUNTER — Other Ambulatory Visit: Payer: Self-pay

## 2021-02-04 ENCOUNTER — Ambulatory Visit (INDEPENDENT_AMBULATORY_CARE_PROVIDER_SITE_OTHER): Payer: No Typology Code available for payment source | Admitting: Physical Therapy

## 2021-02-04 ENCOUNTER — Encounter: Payer: Self-pay | Admitting: Physical Therapy

## 2021-02-04 DIAGNOSIS — M25511 Pain in right shoulder: Secondary | ICD-10-CM

## 2021-02-04 DIAGNOSIS — M6281 Muscle weakness (generalized): Secondary | ICD-10-CM

## 2021-02-04 DIAGNOSIS — G8929 Other chronic pain: Secondary | ICD-10-CM

## 2021-02-04 DIAGNOSIS — M25512 Pain in left shoulder: Secondary | ICD-10-CM | POA: Diagnosis not present

## 2021-02-04 NOTE — Therapy (Signed)
OUTPATIENT PHYSICAL THERAPY SHOULDER EVALUATION   Patient Name: Beverly Dougherty MRN: [id number] DOB:June 04, 1960, 60 y.o., female Today's Date: 02/04/2021   PT End of Session - 02/04/21 1334     Visit Number 1    Number of Visits 12    Date for PT Re-Evaluation 03/18/21    Authorization Type UHC    PT Start Time 1232    PT Stop Time 1310    PT Time Calculation (min) 38 min    Activity Tolerance Patient tolerated treatment well    Behavior During Therapy WFL for tasks assessed/performed             Past Medical History:  Diagnosis Date   Insomnia    related to pain from osteoarthritis   Osteoarthritis    bilat thumbs and hips   Past Surgical History:  Procedure Laterality Date   BREAST ENHANCEMENT SURGERY     CERVICAL FUSION     HERNIA [place]     after childbirth   WISDOM TOOTH EXTRACTION     Patient Active Problem List   Diagnosis Date Noted   Altered mental status    Seizure-like activity ([place])    Hyponatremia 10/05/2020   Acute medial meniscus tear of left knee 09/03/2019   Ganglion cyst of finger of left hand 03/08/2018   Rheumatoid arthritis with positive rheumatoid factor ([place]) 12/19/2017   Polyarthralgia 11/21/2016   Shoulder arthritis 11/21/2016   Sacroiliitis ([place]) 11/11/2015   [place] arthritis 07/07/2015   OA (osteoarthritis) of finger, right 06/01/2015   Osteoarthritis of finger of left hand 06/01/2015   Right trigger finger 06/01/2015    PCP: Derinda Late, MD  REFERRING PROVIDER: Gregor Hams, MD  REFERRING DIAG:  M25.511 (ICD-10-CM) - Pain in joint of right shoulder M25.511,M25.512 (ICD-10-CM) - Acute pain of both shoulders  THERAPY DIAG:  Chronic left shoulder pain  Chronic right shoulder pain  Muscle weakness (generalized)   ONSET DATE: since last august  SUBJECTIVE:                                                                                                                                                                                       SUBJECTIVE STATEMENT: Patient has been having bilateral shoulder pain with L> R and has had one injection in the right and 2 in the left. Reports she has had relief with last injection in her left shoulder. Reports since last august she has been having pain. States she has RA and stage 4 osteoarthritis. States normally she has pain in her back but her shoulders are "throbbing and  aching toothaches" that wake her up at night. When she wakes up on her left shoulder she has increased pain. Driving also bothers her left deltoid. Reports she is very active plays pickle-ball and yoga but hasn't done much recently due to her shoulder pain. States she doesn't know what to do without making things worse.  PERTINENT HISTORY: RA, stage 4 osteoarthritis. Reports she has had PRP injections in knees, seizure histor  PAIN:  Are you having pain? Yes VAS scale: NA/10 no numerical number provided "not that bad since injection" Pain location: bilat shoulder L >R Pain orientation: Bilateral  PAIN TYPE: aching and throbbing Pain description: intermittent  Aggravating factors: driving, yoga Relieving factors: rest, injections  PRECAUTIONS: history of clonic seizure, RA  WEIGHT BEARING RESTRICTIONS No  FALLS:  Has patient fallen in last 6 months? No Number of falls: 0  LIVING ENVIRONMENT: Lives with: lives with their family Lives in: House/apartment   OCCUPATION: previous yoga instructor  PLOF: Independent  PATIENT GOALS to learn what she can do exercise wise without hurting herself given her diagnosis  OBJECTIVE:   DIAGNOSTIC FINDINGS:  01/19/21 x-rays IMPRESSION: Progressive RIGHT glenohumeral degenerative changes. IMPRESSION: Significant LEFT glenohumeral degenerative changes.   COGNITION:  Overall cognitive status: Within functional limits for tasks assessed       UPPER EXTREMITY AROM/PROM:  A/PROM Right 02/04/2021  Left 02/04/2021  Shoulder flexion 165/WNL 140/WNL  Shoulder extension    Shoulder abduction 150/WNL *140/WNL  Shoulder adduction    Shoulder internal rotation WNL WNL  Shoulder external rotation WNL WNL                                  (Blank rows = not tested) *Pain in ipsilateral shoulder  UPPER EXTREMITY MMT:    Bil shoulders: 4+/5, Elbow: 5/5     SHOULDER SPECIAL TESTS: not tested   PALPATION:  Audible grinding with shoulder with most ROM and movements.  Mild tenderness in anterior shoulders and pec, bilaterally.   POSTURE:  Rounded shoulders, hitches at thoracolumbar junction, reduced lumbar lordosis, forward head.    TODAY'S TREATMENT:  02/04/21  Therapeutic Exercise:  Aerobic: Supine: shoulder ER butterfly stretch 15 sec x 10;  shoulder flexion x10 5" holds  Seated:  Standing: shoulder ER red theraband x10 , bicep curls 5# DB x10 bilat, tricep extension blue theraband x10 Bilat; Rows Blue TB x 15;  Neuromuscular Re-education: Manual Therapy: Therapeutic Activity: Self Care: Trigger Point Dry Needling:  Modalities:    PATIENT EDUCATION: Education details: in current presentation, on activity limitations, on POC, on HEP, on benefits of aquatic care, on exercise in AM, on positioning and shoulder postures for IALDLs  Person educated: Patient Education method: Customer service manager Education comprehension: verbalized understanding   HOME EXERCISE PROGRAM: [id number]  ASSESSMENT:  CLINICAL IMPRESSION: Patient is a 60 y.o. female who was seen today for physical therapy evaluation and treatment for bilateral shoulder pain. Objective impairments include decreased ROM, decreased strength, impaired UE functional use, and pain. These impairments are limiting patient from driving and recreational activity . Personal factors including Age and 1-2 comorbidities: RA, OA  are also affecting patient's functional outcome. Patient will benefit from skilled PT to  address above impairments and improve overall function.  Pt presents with primary complaint of increased pain in bil shoulders, that has improved some with recent injections. She has findings and symptoms consistent with severe OA. She has good ROM  and strength bilaterally and wants to stay active. Focus of treatments will be education on joint preservation, and strengthening exercises for fitness that will not over work GHJ. Pt is used to being very active, but has lack of effective routine currently, due to increased pain and deficit. Pt to benefit from skilled PT.    REHAB POTENTIAL: Good  CLINICAL DECISION MAKING: Evolving/moderate complexity  EVALUATION COMPLEXITY: Moderate   GOALS: Goals reviewed with patient? Yes SHORT TERM GOALS:  STG Name Target Date Goal status  1 Patient will be independent with initial HEP   02/18/21 INITIAL  2            LONG TERM GOALS:   LTG Name Target Date Goal status  1 Patient will be independent with final HEP for strengthening and preservation of arthritic joints.   03/18/21 INITIAL  2 Pt to demo ability for ROM in all motions, without pain greater than 2/10 in bil shoulders.   03/18/21 INITIAL  3 Pt to demonstrate ability for optimal UE and shoulder mechanics with reaching, lifting, carrying, and IADLs, for decreased pain in shoulders.  03/18/21 INITIAL  4      5         PLAN: PT FREQUENCY: 1x/week  PT DURATION: 6 weeks  PLANNED INTERVENTIONS: Therapeutic exercises, Therapeutic activity, Neuro Muscular re-education, Balance training, Gait training, Patient/Family education, Joint mobilization, Aquatic Therapy, Dry Needling, Electrical stimulation, Cryotherapy, Moist heat, and Manual therapy    Lyndee Hensen, PT, DPT 1:48 PM  02/04/21

## 2021-02-11 ENCOUNTER — Encounter: Payer: No Typology Code available for payment source | Admitting: Physical Therapy

## 2021-02-18 ENCOUNTER — Ambulatory Visit: Payer: No Typology Code available for payment source | Admitting: Physical Therapy

## 2021-02-18 ENCOUNTER — Other Ambulatory Visit: Payer: Self-pay

## 2021-02-18 ENCOUNTER — Encounter: Payer: Self-pay | Admitting: Physical Therapy

## 2021-02-18 DIAGNOSIS — G8929 Other chronic pain: Secondary | ICD-10-CM

## 2021-02-18 DIAGNOSIS — M6281 Muscle weakness (generalized): Secondary | ICD-10-CM

## 2021-02-18 DIAGNOSIS — M25511 Pain in right shoulder: Secondary | ICD-10-CM

## 2021-02-18 DIAGNOSIS — M25512 Pain in left shoulder: Secondary | ICD-10-CM | POA: Diagnosis not present

## 2021-02-18 NOTE — Therapy (Signed)
OUTPATIENT PHYSICAL THERAPY SHOULDER TREATMENT   Patient Name: Beverly Dougherty MRN: [id number] DOB:1960/06/24, 61 y.o., female Today's Date: 02/18/2021   PT End of Session - 02/18/21 1100     Visit Number 2    Number of Visits 12    Date for PT Re-Evaluation 03/18/21    Authorization Type UHC    PT Start Time 1016    PT Stop Time 1056    PT Time Calculation (min) 40 min    Activity Tolerance Patient tolerated treatment well    Behavior During Therapy WFL for tasks assessed/performed              Past Medical History:  Diagnosis Date   Insomnia    related to pain from osteoarthritis   Osteoarthritis    bilat thumbs and hips   Past Surgical History:  Procedure Laterality Date   BREAST ENHANCEMENT SURGERY     CERVICAL FUSION     HERNIA REPAIR     TONSILLECTOMY     URETHRAL FISTULA REPAIR     after childbirth   WISDOM TOOTH EXTRACTION     Patient Active Problem List   Diagnosis Date Noted   Altered mental status    Seizure-like activity ([place])    Hyponatremia 10/05/2020   Acute medial meniscus tear of left knee 09/03/2019   Ganglion cyst of finger of left hand 03/08/2018   Rheumatoid arthritis with positive rheumatoid factor ([place]) 12/19/2017   Polyarthralgia 11/21/2016   Shoulder arthritis 11/21/2016   Sacroiliitis ([place]) 11/11/2015   [place] arthritis 07/07/2015   OA (osteoarthritis) of finger, right 06/01/2015   Osteoarthritis of finger of left hand 06/01/2015   Right trigger finger 06/01/2015    PCP: Derinda Late, MD  REFERRING PROVIDER: Derinda Late, MD  REFERRING DIAG:  M25.511 (ICD-10-CM) - Pain in joint of right shoulder M25.511,M25.512 (ICD-10-CM) - Acute pain of both shoulders  THERAPY DIAG:  Chronic left shoulder pain  Chronic right shoulder pain  Muscle weakness (generalized)    SUBJECTIVE:                                                                                                                                                                                       SUBJECTIVE STATEMENT: Pt has been doing HEP. Hears shoulders "cracking" but has had no pain with exercises.    PERTINENT HISTORY: RA, stage 4 osteoarthritis. Reports she has had PRP injections in knees, seizure history  PAIN:  Are you having pain? Yes VAS scale:  1/10 Pain location: bilat shoulder  Pain orientation: Bilateral  PAIN TYPE: aching and throbbing Pain description: intermittent  Aggravating factors: driving, yoga Relieving factors: rest,  injections  PRECAUTIONS: history of clonic seizure, RA  PATIENT GOALS to learn what she can do exercise wise without hurting herself given her diagnosis  OBJECTIVE:   DIAGNOSTIC FINDINGS:  01/19/21 x-rays IMPRESSION: Progressive RIGHT glenohumeral degenerative changes. IMPRESSION: Significant LEFT glenohumeral degenerative changes.   COGNITION:  Overall cognitive status: Within functional limits for tasks assessed       UPPER EXTREMITY AROM/PROM:  A/PROM Right 02/18/2021 Left 02/18/2021  Shoulder flexion 165/WNL 140/WNL  Shoulder extension    Shoulder abduction 150/WNL *140/WNL  Shoulder adduction    Shoulder internal rotation WNL WNL  Shoulder external rotation WNL WNL                                  (Blank rows = not tested) *Pain in ipsilateral shoulder  UPPER EXTREMITY MMT:    Bil shoulders: 4+/5, Elbow: 5/5    SHOULDER SPECIAL TESTS: not tested   PALPATION:  Audible grinding with shoulder with most ROM and movements.  Mild tenderness in anterior shoulders and pec, bilaterally.   POSTURE:  Rounded shoulders, hitches at thoracolumbar junction, reduced lumbar lordosis, forward head.    TODAY'S TREATMENT:    02/18/21:  Therapeutic Exercise:  Aerobic: Supine: shoulder ER butterfly stretch 15 sec x 10;  shoulder flexion x10 5" holds, 2 hands together, x 15 with cane;  horizontal Abd RTB x 15;   Seated:  Standing: shoulder (unilateral) ER red theraband x 20 on L  and R, bicep curls 6# DB x20 bilat, Low RowsGreen TB x 20; Bent over rows 5lb x 15;  AROM/elevation x 15;   Neuromuscular Re-education: Manual Therapy:    02/04/21  Therapeutic Exercise:  Aerobic: Supine: shoulder ER butterfly stretch 15 sec x 10;  shoulder flexion x10 5" holds  Seated:  Standing: shoulder ER red theraband x10 , bicep curls 5# DB x10 bilat, tricep extension blue theraband x10 Bilat; Rows Blue TB x 15;  Neuromuscular Re-education: Manual Therapy: Therapeutic Activity: Self Care: Trigger Point Dry Needling:  Modalities:    PATIENT EDUCATION: Education details: updated and reviewed HEP Person educated: Patient Education method: Explanation, Demonstration, Tactile cues, Verbal cues, and Handouts Education comprehension: verbalized understanding   HOME EXERCISE PROGRAM: [id number]  ASSESSMENT:  CLINICAL IMPRESSION:  02/18/21:    Pt with good ability for ther ex progression today, with light strengthening. Focus on shoulder position, and protection of joint with neutral strengthening for shoulders and scapular muscles. HEP updated today. Pt to benefit from continued care for education on strengthening and exercises for OA.   Objective impairments include decreased ROM, decreased strength, impaired UE functional use, and pain. These impairments are limiting patient from driving and recreational activity . Personal factors including Age and 1-2 comorbidities: RA, OA  are also affecting patient's functional outcome. Patient will benefit from skilled PT to address above impairments and improve overall function.   REHAB POTENTIAL: Good  CLINICAL DECISION MAKING: Evolving/moderate complexity  EVALUATION COMPLEXITY: Moderate   GOALS: Goals reviewed with patient? Yes SHORT TERM GOALS:  STG Name Target Date Goal status  1 Patient will be independent with initial HEP   02/18/21 INITIAL  2            LONG TERM GOALS:   LTG Name Target Date Goal status  1  Patient will be independent with final HEP for strengthening and preservation of arthritic joints.   03/18/21 INITIAL  2 Pt to demo ability for  ROM in all motions, without pain greater than 2/10 in bil shoulders.   03/18/21 INITIAL  3 Pt to demonstrate ability for optimal UE and shoulder mechanics with reaching, lifting, carrying, and IADLs, for decreased pain in shoulders.  03/18/21 INITIAL  4      5         PLAN: PT FREQUENCY: 1x/week  PT DURATION: 6 weeks  PLANNED INTERVENTIONS: Therapeutic exercises, Therapeutic activity, Neuro Muscular re-education, Balance training, Gait training, Patient/Family education, Joint mobilization, Aquatic Therapy, Dry Needling, Electrical stimulation, Cryotherapy, Moist heat, and Manual therapy    Lyndee Hensen, PT, DPT 11:00 AM  02/18/21

## 2021-02-25 ENCOUNTER — Ambulatory Visit: Payer: No Typology Code available for payment source | Admitting: Physical Therapy

## 2021-02-25 ENCOUNTER — Encounter: Payer: Self-pay | Admitting: Physical Therapy

## 2021-02-25 ENCOUNTER — Other Ambulatory Visit: Payer: Self-pay

## 2021-02-25 DIAGNOSIS — M6281 Muscle weakness (generalized): Secondary | ICD-10-CM | POA: Diagnosis not present

## 2021-02-25 DIAGNOSIS — M25511 Pain in right shoulder: Secondary | ICD-10-CM

## 2021-02-25 DIAGNOSIS — M25512 Pain in left shoulder: Secondary | ICD-10-CM | POA: Diagnosis not present

## 2021-02-25 DIAGNOSIS — G8929 Other chronic pain: Secondary | ICD-10-CM

## 2021-02-25 NOTE — Therapy (Signed)
°OUTPATIENT PHYSICAL THERAPY SHOULDER TREATMENT/ DISCHARGE  ° ° °Patient Name: Beverly Dougherty °MRN: [id number] °DOB:09/01/1960, 60 y.o., female °Today's Date: 02/25/2021 ° ° PT End of Session - 02/25/21 1311   ° ° Visit Number 3   ° Number of Visits 12   ° Date for PT Re-Evaluation 03/18/21   ° Authorization Type UHC   ° PT Start Time 1235   ° PT Stop Time 1310   ° PT Time Calculation (min) 35 min   ° Activity Tolerance Patient tolerated treatment well   ° Behavior During Therapy WFL for tasks assessed/performed   ° °  °  ° °  ° ° ° ° °Past Medical History:  °Diagnosis Date  ° Insomnia   ° related to pain from osteoarthritis  ° Osteoarthritis   ° bilat thumbs and hips  ° °Past Surgical History:  °Procedure Laterality Date  ° BREAST ENHANCEMENT SURGERY    ° CERVICAL FUSION    ° HERNIA REPAIR    ° TONSILLECTOMY    ° URETHRAL FISTULA REPAIR    ° after childbirth  ° WISDOM TOOTH EXTRACTION    ° °Patient Active Problem List  ° Diagnosis Date Noted  ° Altered mental status   ° Seizure-like activity ([facility])   ° Hyponatremia 10/05/2020  ° Acute medial meniscus tear of left knee 09/03/2019  ° Ganglion cyst of finger of left hand 03/08/2018  ° Rheumatoid arthritis with positive rheumatoid factor ([facility]) 12/19/2017  ° Polyarthralgia 11/21/2016  ° Shoulder arthritis 11/21/2016  ° Sacroiliitis ([facility]) 11/11/2015  ° [place] arthritis 07/07/2015  ° OA (osteoarthritis) of finger, right 06/01/2015  ° Osteoarthritis of finger of left hand 06/01/2015  ° Right trigger finger 06/01/2015  ° ° °PCP: Blomgren, Peter, MD ° °REFERRING PROVIDER: Evan Corey  ° °REFERRING DIAG:  °M25.511 (ICD-10-CM) - Pain in joint of right shoulder °M25.511,M25.512 (ICD-10-CM) - Acute pain of both shoulders ° °THERAPY DIAG:  °Chronic left shoulder pain ° °Chronic right shoulder pain ° °Muscle weakness (generalized) ° ° ° °SUBJECTIVE:                                                                                                                                                                                      ° °SUBJECTIVE STATEMENT: °Pt has been doing HEP. Hears shoulders "cracking" at times but has had no pain with exercises.  ° ° °PERTINENT HISTORY: °RA, stage 4 osteoarthritis. Reports she has had PRP injections in knees, seizure history ° °PAIN:  °Are you having pain? Yes °VAS scale:  1/10 °Pain location: bilat shoulder  °Pain orientation: Bilateral  °PAIN TYPE: aching and throbbing °Pain description: intermittent  °Aggravating factors:   factors: driving, yoga Relieving factors: rest, injections  PRECAUTIONS: history of clonic seizure, RA  PATIENT GOALS to learn what she can do exercise wise without hurting herself given her diagnosis  OBJECTIVE:   DIAGNOSTIC FINDINGS:  01/19/21 x-rays IMPRESSION: Progressive RIGHT glenohumeral degenerative changes. IMPRESSION: Significant LEFT glenohumeral degenerative changes.   COGNITION:  Overall cognitive status: Within functional limits for tasks assessed       UPPER EXTREMITY AROM/PROM:  A/PROM Right 02/25/2021 Left 02/25/2021  Shoulder flexion 165/WNL 140/WNL  Shoulder extension    Shoulder abduction 150/WNL *140/WNL  Shoulder adduction    Shoulder internal rotation WNL WNL  Shoulder external rotation WNL WNL                                  (Blank rows = not tested) *Pain in ipsilateral shoulder  UPPER EXTREMITY MMT:    Bil shoulders: 4+/5, Elbow: 5/5    SHOULDER SPECIAL TESTS: not tested   PALPATION:  Audible grinding with shoulder with most ROM and movements.  Mild tenderness in anterior shoulders and pec, bilaterally.   POSTURE:  Rounded shoulders, hitches at thoracolumbar junction, reduced lumbar lordosis, forward head.    TODAY'S TREATMENT:   02/25/21: Therapeutic Exercise:  Aerobic: Supine: shoulder ER butterfly stretch 15 sec x 10;  shoulder flexion AROM  x15;   horizontal Abd GTB x 20;   Seated:  Standing: shoulder (unilateral) ER Green theraband x 20 on L and R, bicep curls 7#  DB x20 bilat, Low RowsGreen TB x 20; Bent over rows 7lb x 15;  Bent over tricep extension 7 lb x 15;  Neuromuscular Re-education: Manual Therapy:    02/18/21:  Therapeutic Exercise:  Aerobic: Supine: shoulder ER butterfly stretch 15 sec x 10;  shoulder flexion x10 5" holds, 2 hands together, x 15 with cane;  horizontal Abd RTB x 15;   Seated:  Standing: shoulder (unilateral) ER red theraband x 20 on L and R, bicep curls 6# DB x20 bilat, Low RowsGreen TB x 20; Bent over rows 5lb x 15;  AROM/elevation x 15;   Neuromuscular Re-education: Manual Therapy:    02/04/21  Therapeutic Exercise:  Aerobic: Supine: shoulder ER butterfly stretch 15 sec x 10;  shoulder flexion x10 5" holds  Seated:  Standing: shoulder ER red theraband x10 , bicep curls 5# DB x10 bilat, tricep extension blue theraband x10 Bilat; Rows Blue TB x 15;  Neuromuscular Re-education: Manual Therapy: Therapeutic Activity: Self Care: Trigger Point Dry Needling:  Modalities:    PATIENT EDUCATION: Education details: updated and reviewed HEP Person educated: Patient Education method: Explanation, Demonstration, Tactile cues, Verbal cues, and Handouts Education comprehension: verbalized understanding   HOME EXERCISE PROGRAM: [id number]  ASSESSMENT:  CLINICAL IMPRESSION:  02/25/21:  Pt doing well with HEP and ability for exercise at this time. She is performing safe strengthening for shoulders/OA and arms. Continued education on not over doing ROM or pushing into pain. Updated and reviewed final HEP. Pt with little pain at time and is doing well managing shoulder OA. Pt ready for d/c to HEP at this time. Will f/u with MD as needed if pain increases.   Objective impairments include decreased ROM, decreased strength, impaired UE functional use, and pain. These impairments are limiting patient from driving and recreational activity . Personal factors including Age and 1-2 comorbidities: RA, OA  are also affecting  patient's functional outcome. Patient will benefit from skilled PT to address  above impairments and improve overall function.   REHAB POTENTIAL: Good  CLINICAL DECISION MAKING: Evolving/moderate complexity  EVALUATION COMPLEXITY: Moderate   GOALS: Goals reviewed with patient? Yes SHORT TERM GOALS:  STG Name Target Date Goal status  1 Patient will be independent with initial HEP   02/18/21 MET  2            LONG TERM GOALS:   LTG Name Target Date Goal status  1 Patient will be independent with final HEP for strengthening and preservation of arthritic joints.   03/18/21 MET  2 Pt to demo ability for ROM in all motions, without pain greater than 2/10 in bil shoulders.   03/18/21 MET  3 Pt to demonstrate ability for optimal UE and shoulder mechanics with reaching, lifting, carrying, and IADLs, for decreased pain in shoulders.  03/18/21 MET  4      5         PLAN: PT FREQUENCY: 1x/week  PT DURATION: 6 weeks  PLANNED INTERVENTIONS: Therapeutic exercises, Therapeutic activity, Neuro Muscular re-education, Balance training, Gait training, Patient/Family education, Joint mobilization, Aquatic Therapy, Dry Needling, Electrical stimulation, Cryotherapy, Moist heat, and Manual therapy   Lyndee Hensen, PT, DPT 1:16 PM  02/25/21   PHYSICAL THERAPY DISCHARGE SUMMARY  Visits from Start of Care: 3 Plan: Patient agrees to discharge.  Patient goals were met. Patient is being discharged due to meeting the stated rehab goals.     Lyndee Hensen, PT, DPT 1:16 PM  02/25/21

## 2021-03-02 ENCOUNTER — Ambulatory Visit: Payer: No Typology Code available for payment source | Admitting: Family Medicine

## 2021-03-03 ENCOUNTER — Ambulatory Visit: Payer: No Typology Code available for payment source | Admitting: Neurology

## 2021-03-04 ENCOUNTER — Encounter: Payer: No Typology Code available for payment source | Admitting: Physical Therapy

## 2021-07-01 NOTE — Progress Notes (Deleted)
   I, [name], LAT, ATC, am serving as scribe for Dr. [name].  Beverly Dougherty is a 61 y.o. female who presents to [facility] today for r hip and R foot pain.  She was last seen by Dr [name] on 01/19/21 for B shoulder pain and had B subacromial steroid injections.  Today, pt reports R hip and R foot pain.  R hip pain: -Radiating pain: -Mechanical symptoms: -Aggravating factors: -Treatments tried:  R foot pain: -Swelling: -Aggravating factors: -Treatments tried:    Pertinent review of systems: ***  Relevant historical information: ***   Exam:  LMP 03/23/2015  General: Well Developed, well nourished, and in no acute distress.   MSK: ***    Lab and Radiology Results No results found for this or any previous visit (from the past 72 hour(s)). No results found.     Assessment and Plan: 61 y.o. female with ***   PDMP not reviewed this encounter. No orders of the defined types were placed in this encounter.  No orders of the defined types were placed in this encounter.    Discussed warning signs or symptoms. Please see discharge instructions. Patient expresses understanding.   ***

## 2021-07-05 ENCOUNTER — Ambulatory Visit: Payer: No Typology Code available for payment source | Admitting: Family Medicine

## 2021-07-06 ENCOUNTER — Ambulatory Visit (INDEPENDENT_AMBULATORY_CARE_PROVIDER_SITE_OTHER): Payer: No Typology Code available for payment source | Admitting: Family Medicine

## 2021-07-06 ENCOUNTER — Ambulatory Visit (INDEPENDENT_AMBULATORY_CARE_PROVIDER_SITE_OTHER): Payer: No Typology Code available for payment source

## 2021-07-06 VITALS — BP 132/94 | HR 99 | Ht 63.0 in | Wt 132.2 lb

## 2021-07-06 DIAGNOSIS — M48062 Spinal stenosis, lumbar region with neurogenic claudication: Secondary | ICD-10-CM

## 2021-07-06 NOTE — Patient Instructions (Signed)
Good to see you  Get x rays on your way out MRI ordered they will cal to schedule Follow up after the MRI

## 2021-07-06 NOTE — Progress Notes (Signed)
   I, [name], LAT, ATC acting as a scribe for [name], MD.  Beverly Dougherty is a 61 y.o. female who presents to [place] at [facility] today for R hip and R foot pain. Of note, pt has RA and is currently being treated by [facility]. Pt was previously seen by Dr. [name] on 01/27/21 for chronic bilat shoulder pain. Today, pt c/o R hip pain that has worsened. Pt reports that R hip pain varies, anterior, lateral, and posterior aspect, w/ radiating pain only on the dorsum of the R foot. Pt was supposed to have lumbar fusion 10 years ago, but never followed through.   This pain has been worsening since October 2022.  Since then she has been performing home exercise program as directed previously by a physician.  She denies any falls or injury.  She denies any new weakness or numbness bladder or bowel change.  Radiates: yes Aggravates: standing, walking Treatments tried: stretch, yoga  Dx imaging: 10/26/20 L-spine XR  Pertinent review of systems: No fevers or chills.  Relevant historical information: Right arthritis.   Exam:  BP (!) 132/94   Pulse 99   Ht '5\' 3"'$  (1.6 m)   Wt 132 lb 3.2 oz (60 kg)   LMP 03/23/2015   SpO2 98%   BMI 23.42 kg/m  General: Well Developed, well nourished, and in no acute distress.   MSK: L-spine: Nontender midline. Decreased lumbar motion pain with extension. Lower extremity strength intact. Reflexes are intact. Positive right-sided slump test.     Lab and Radiology Results  X-ray images L-spine obtained today personally and independently interpreted Degenerative scoliosis change.  Multilevel degenerative changes without acute fracture. Await formal radiology review    Assessment and Plan: 61 y.o. female with lumbar radiculopathy right leg in L5 dermatomal pattern.  Symptoms have been ongoing since October 2022.  She had a good trial of conservative management including home exercise program.  At this point  plan for MRI for epidural steroid injection planning.   PDMP not reviewed this encounter. Orders Placed This Encounter  Procedures   MR Lumbar Spine Wo Contrast    Standing Status:   Future    Standing Expiration Date:   07/07/2022    Order Specific Question:   What is the patient's sedation requirement?    Answer:   No Sedation    Order Specific Question:   Does the patient have a pacemaker or implanted devices?    Answer:   No    Order Specific Question:   Preferred imaging location?    Answer:   [facility] (table limit-350lbs)   DG Lumbar Spine 2-3 Views    Standing Status:   Future    Number of Occurrences:   1    Standing Expiration Date:   07/07/2022    Order Specific Question:   Reason for Exam (SYMPTOM  OR DIAGNOSIS REQUIRED)    Answer:   eval lumbar pain    Order Specific Question:   Is patient pregnant?    Answer:   No    Order Specific Question:   Preferred imaging location?    Answer:   [facility]   No orders of the defined types were placed in this encounter.    Discussed warning signs or symptoms. Please see discharge instructions. Patient expresses understanding.   The above documentation has been reviewed and is accurate and complete [name], M.D.

## 2021-07-08 NOTE — Progress Notes (Signed)
Lumbar spine x-ray shows multilevel arthritis changes

## 2021-07-13 ENCOUNTER — Telehealth: Payer: Self-pay | Admitting: Family Medicine

## 2021-07-13 NOTE — Telephone Encounter (Signed)
Pt called insurance and confirms she has no benefits left for diagnostic imaging. She wants to do this as self pay. [facility] called [facility] but was instructed to call us back. I called [facility] and they did not seem to know how to handle scheduling a self pay and will have Bonnita Nasuti call us in the morning.

## 2021-07-13 NOTE — Telephone Encounter (Signed)
Pt called about MRI. Repeated info she was already given about her using all of her diagnostic imaging benefits this year per her insurance. Pt seems to think this may not be correct as she recently renewed benefits. Informed pt to call insurance to confirm and let us know if she wants to use insurance or proceed at self pay.

## 2021-07-14 ENCOUNTER — Telehealth: Payer: Self-pay | Admitting: Family Medicine

## 2021-07-14 ENCOUNTER — Other Ambulatory Visit: Payer: Self-pay | Admitting: Physical Therapy

## 2021-07-14 DIAGNOSIS — G8929 Other chronic pain: Secondary | ICD-10-CM

## 2021-07-14 DIAGNOSIS — M19012 Primary osteoarthritis, left shoulder: Secondary | ICD-10-CM

## 2021-07-14 NOTE — Telephone Encounter (Signed)
L shoulder MRI ordered to [facility] where prior L-spine MRI was ordered.

## 2021-07-14 NOTE — Telephone Encounter (Signed)
Patient called stating that at this point, her left shoulder is causing more pain than her lower back. She said that it is affecting her daily life, driving, sleeping, etc.  She asked if another MRI could be ordered for her left shoulder so that she can have the lower back and shoulder done at the same time?  Please advise.

## 2021-07-15 NOTE — Telephone Encounter (Signed)
Called pt and relayed fact that we placed additional order for a L shoulder MRI in addition to her lumbar spine MRI.  Advised to return call if she does not hear from imaging location by mid-week next week regarding scheduling.

## 2021-07-19 ENCOUNTER — Ambulatory Visit (INDEPENDENT_AMBULATORY_CARE_PROVIDER_SITE_OTHER): Payer: No Typology Code available for payment source

## 2021-07-19 DIAGNOSIS — M25512 Pain in left shoulder: Secondary | ICD-10-CM

## 2021-07-19 DIAGNOSIS — M19012 Primary osteoarthritis, left shoulder: Secondary | ICD-10-CM

## 2021-07-19 DIAGNOSIS — G8929 Other chronic pain: Secondary | ICD-10-CM

## 2021-07-19 NOTE — Telephone Encounter (Signed)
MRI Shoulder scheduled for 07/19/2021.

## 2021-07-20 NOTE — Progress Notes (Signed)
Shoulder MRI shows severe shoulder arthritis is the main source of pain.  There is some mild tendinitis that can be a source of pain but compared to the arthritis the tendinitis is pretty mild.  We could do another shoulder injection which would probably work for a little while.  I think ultimately you probably need a shoulder replacement.  Feel free to schedule with me for shoulder injection and to go over the results in full detail in the near future.

## 2021-07-22 ENCOUNTER — Ambulatory Visit (INDEPENDENT_AMBULATORY_CARE_PROVIDER_SITE_OTHER): Payer: No Typology Code available for payment source | Admitting: Family Medicine

## 2021-07-22 ENCOUNTER — Ambulatory Visit: Payer: Self-pay

## 2021-07-22 VITALS — BP 142/88 | HR 87 | Ht 63.0 in | Wt 132.4 lb

## 2021-07-22 DIAGNOSIS — M059 Rheumatoid arthritis with rheumatoid factor, unspecified: Secondary | ICD-10-CM

## 2021-07-22 DIAGNOSIS — G8929 Other chronic pain: Secondary | ICD-10-CM

## 2021-07-22 DIAGNOSIS — M19012 Primary osteoarthritis, left shoulder: Secondary | ICD-10-CM | POA: Diagnosis not present

## 2021-07-22 DIAGNOSIS — Z780 Asymptomatic menopausal state: Secondary | ICD-10-CM

## 2021-07-22 DIAGNOSIS — M25512 Pain in left shoulder: Secondary | ICD-10-CM | POA: Diagnosis not present

## 2021-07-22 NOTE — Patient Instructions (Addendum)
Thank you for coming in today.   You received a steroid injection in your left shoulder today. Seek immediate medical attention if the joint becomes red, extremely painful, or is oozing fluid.   Schedule the bone density test  Please get the lumbar MRI scheduled.  Check back with me as needed

## 2021-07-22 NOTE — Progress Notes (Signed)
Beverly Dougherty is a 61 y.o. female who presents to [place] at [facility] today for chronic left shoulder pain and MRI review. Beverly Dougherty has a history of chronic multijoint arthralgia but specific for this visit chronic shoulder pain on the left side.  She has had trials of physical therapy and subacromial injection as well as glenohumeral injections in the past.  Previous glenohumeral injection was December 2022 which worked for a little while. Today, pt reports L shoulder pain is keeping her up at night. Pt continues to have R hip pain as well. Pt experienced an instance yesterday when R hip gave out on her while shopping. Pt locates pain to the anterior aspect of the R hip, with varying locations day-to-day.  Dx imaging: 07/19/21 L shoulder MRI  07/06/21 L-spine XR  01/19/21 R & L shoulder XR  Pertinent review of systems: No fevers or chills  Relevant historical information: Rheumatoid arthritis   Exam:  BP (!) 142/88   Pulse 87   Ht '5\' 3"'$  (1.6 m)   Wt 132 lb 6.4 oz (60.1 kg)   LMP 03/23/2015   SpO2 100%   BMI 23.45 kg/m  General: Well Developed, well nourished, and in no acute distress.   MSK: Left shoulder decreased range of motion pain with range of motion.    Lab and Radiology Results  Procedure: Real-time Ultrasound Guided Injection of left shoulder glenohumeral joint posterior approach Device: Philips Affiniti 50G Images permanently stored and available for review in PACS Verbal informed consent obtained.  Discussed risks and benefits of procedure. Warned about infection, bleeding, hyperglycemia damage to structures among others. Patient expresses understanding and agreement Time-out conducted.   Noted no overlying erythema, induration, or other signs of local infection.   Skin prepped in a sterile fashion.   Local anesthesia: Topical Ethyl chloride.   With sterile technique and under real time ultrasound guidance: 40 mg of Kenalog and 2 mL of  lidocaine injected into shoulder joint. Fluid seen entering the joint capsule.   Completed without difficulty   Pain immediately resolved suggesting accurate placement of the medication.   Advised to call if fevers/chills, erythema, induration, drainage, or persistent bleeding.   Images permanently stored and available for review in the ultrasound unit.  Impression: Technically successful ultrasound guided injection.      EXAM: MRI OF THE LEFT SHOULDER WITHOUT CONTRAST   TECHNIQUE: Multiplanar, multisequence MR imaging of the shoulder was performed. No intravenous contrast was administered.   COMPARISON:  Left shoulder radiographs 01/19/2021   FINDINGS: Despite efforts by the technologist and patient, motion artifact is present on today's exam and could not be eliminated. This reduces exam sensitivity and specificity.   Rotator cuff: Mild subcortical cystic change within the humeral head deep to the supraspinatus and infraspinatus tendon insertions. Minimal infraspinatus greater than supraspinatus intermediate T2 signal tendinosis. The subscapularis and teres minor are intact.   Muscles: No rotator cuff muscle atrophy, fatty infiltration, or edema.   Biceps long head: There is moderate fluid within the biceps tendon sheath which is slightly out of proportion to a mild glenohumeral joint effusion suggesting mild tenosynovitis. There is a 7 mm low signal loose body within the medial aspect of the biceps tendon sheath at the level of the proximal humeral metadiaphysis (axial image 16, sagittal image 10, coronal image 13). Minimal proximal long head biceps tendinosis proximal to the bicipital groove.   Acromioclavicular Joint: There are mild degenerative changes of the acromioclavicular joint including  joint space narrowing, subchondral marrow edema, and peripheral osteophytosis. Type II acromion.   Trace fluid within the subacromial/subdeltoid bursa.   Glenohumeral Joint:  Diffuse high-grade partial to full-thickness cartilage loss throughout the glenoid and humeral head. Moderate glenoid diffuse subchondral marrow edema.   Labrum: Moderate to high-grade diffuse degenerative attenuation of the posterior and superior aspects of the glenoid labrum.   Bones:  No acute fracture.   Other: Moderate fluid is seen within the subscapular recess.   IMPRESSION: 1. Minimal infraspinatus greater than supraspinatus tendinosis. No rotator cuff tear. 2. Mild long head of the biceps tenosynovitis. Minimal proximal long head of the biceps tendinosis. 3. Mild degenerative changes of the acromioclavicular joint. 4. Severe glenohumeral osteoarthritis. Small loose body within the biceps tendon sheath.     Electronically Signed   By: Yvonne Kendall M.D.   On: 07/20/2021 10:57   I, [name], personally (independently) visualized and performed the interpretation of the images attached in this note.     Assessment and Plan: 61 y.o. female with left shoulder pain due to severe glenohumeral DJD.  Plan for steroid injection today.  This is not going to be a long-lasting treatment strategy for her.  Ultimately she is going to require total shoulder replacement.  I will refer to orthopedic surgery now to discuss total shoulder replacement.  She would like to delay until the fall which is reasonable.  However she has rheumatoid arthritis and has been on steroids in the past and is at risk for decreased bone mineral density.  Plan for DEXA scan to evaluate for bone density now so that we may be able to correct it a bit before potential shoulder replacement.  Additionally she has continued right leg and hip pain and weakness.  The plan for this was to proceed to lumbar spine MRI.  This MRI has been ordered.  I encouraged that she get it done.   PDMP not reviewed this encounter. Orders Placed This Encounter  Procedures   DG BONE DENSITY (DXA)    Standing Status:   Future     Standing Expiration Date:   07/23/2022    Order Specific Question:   Reason for Exam (SYMPTOM  OR DIAGNOSIS REQUIRED)    Answer:   eval bone density    Order Specific Question:   Is the patient pregnant?    Answer:   No    Order Specific Question:   Preferred imaging location?    Answer:   [place]   [place] LIMITED JOINT SPACE STRUCTURES LOW RIGHT(NO LINKED CHARGES)    Order Specific Question:   Reason for Exam (SYMPTOM  OR DIAGNOSIS REQUIRED)    Answer:   right shoulder pain    Order Specific Question:   Preferred imaging location?    Answer:   [place]   Ambulatory referral to Orthopedic Surgery    Referral Priority:   Routine    Referral Type:   Surgical    Referral Reason:   Specialty Services Required    Referred to Provider:   Vanetta Mulders, MD    Requested Specialty:   Orthopedic Surgery    Number of Visits Requested:   1   No orders of the defined types were placed in this encounter.    Discussed warning signs or symptoms. Please see discharge instructions. Patient expresses understanding.   The above documentation has been reviewed and is accurate and complete [name], M.D.

## 2021-07-26 ENCOUNTER — Ambulatory Visit (INDEPENDENT_AMBULATORY_CARE_PROVIDER_SITE_OTHER)
Admission: RE | Admit: 2021-07-26 | Discharge: 2021-07-26 | Disposition: A | Discharge status: Home / Self Care | Payer: No Typology Code available for payment source | Source: Ambulatory Visit | Attending: Family Medicine | Admitting: Family Medicine

## 2021-07-26 DIAGNOSIS — G8929 Other chronic pain: Secondary | ICD-10-CM

## 2021-07-26 DIAGNOSIS — M059 Rheumatoid arthritis with rheumatoid factor, unspecified: Secondary | ICD-10-CM

## 2021-07-26 DIAGNOSIS — Z780 Asymptomatic menopausal state: Secondary | ICD-10-CM

## 2021-08-31 ENCOUNTER — Ambulatory Visit (INDEPENDENT_AMBULATORY_CARE_PROVIDER_SITE_OTHER): Payer: No Typology Code available for payment source | Admitting: Orthopaedic Surgery

## 2021-08-31 ENCOUNTER — Telehealth: Payer: Self-pay | Admitting: Family Medicine

## 2021-08-31 DIAGNOSIS — M19012 Primary osteoarthritis, left shoulder: Secondary | ICD-10-CM | POA: Diagnosis not present

## 2021-08-31 NOTE — Progress Notes (Signed)
Chief Complaint: Left shoulder pain     History of Present Illness:    Beverly Dougherty is a 61 y.o. female presents today with ongoing left shoulder pain that has been going on for several years.  She states that this has been very severe the last several months.  She is at the point where she is having a very difficult time sleeping on the side which is her preferred side to sleep on.  She has pain with overhead activity and motion.  She does enjoy playing pickle ball and being active with her husband although this has been quite limited as result of her shoulder.  She is at this point being woken up multiple times at night which is significantly bothersome to her.  She does have a history of rheumatoid arthritis.  She is on Mobic as well as hydrocodone for arthritis in multiple points in her body.  She has previously undergone C6-C7 fusion for a cervical radiculopathy.  She is here today for further assessment.  She has previously been being treated by Dr. [name].  He has performed several intra-articular glenohumeral injections the most recent of which was 5 weeks prior which gave temporary relief but has now worn off.  She is here today for further assessment and treatment.  She has also completed an at home supervised shoulder strengthening program but this did not provide significant long-lasting relief.    Surgical History:   None  PMH/PSH/Family History/Social History/Meds/Allergies:    Past Medical History:  Diagnosis Date   Insomnia    related to pain from osteoarthritis   Osteoarthritis    bilat thumbs and hips   Past Surgical History:  Procedure Laterality Date   BREAST ENHANCEMENT SURGERY     CERVICAL FUSION     HERNIA REPAIR     TONSILLECTOMY     URETHRAL FISTULA REPAIR     after childbirth   WISDOM TOOTH EXTRACTION     Social History   Socioeconomic History   Marital status: Married    Spouse name: Not on file   Number of  children: 1   Years of education: 14   Highest education level: Not on file  Occupational History   Not on file  Tobacco Use   Smoking status: Former    Packs/day: 0.50    Years: 5.00    Total pack years: 2.50    Types: Cigarettes    Quit date: 09/25/1984    Years since quitting: 36.9   Smokeless tobacco: Never  Substance and Sexual Activity   Alcohol use: Yes    Comment: socially   Drug use: No   Sexual activity: Not on file  Other Topics Concern   Not on file  Social History Narrative   Right handed   Drinks caffeine   Two story home   Social Determinants of Health   Financial Resource Strain: Not on file  Food Insecurity: Not on file  Transportation Needs: Not on file  Physical Activity: Not on file  Stress: Not on file  Social Connections: Not on file   Family History  Problem Relation Age of Onset   Colon cancer Neg Hx    Allergies  Allergen Reactions   Other    Current Outpatient Medications  Medication Sig Dispense Refill   acetaminophen (TYLENOL)  500 MG tablet Take 1,000 mg by mouth at bedtime.     Albuterol Sulfate, sensor, (PROAIR DIGIHALER) 108 (90 Base) MCG/ACT AEPB Inhale 2 puffs into the lungs every 6 (six) hours as needed (sob/wheezing). (Patient not taking: Reported on 07/22/2021)     ALPRAZolam (XANAX) 0.5 MG tablet Take 0.5 mg by mouth 2 (two) times daily as needed. (Patient not taking: Reported on 07/22/2021)     ALPRAZolam (XANAX) 1 MG tablet Take 1 mg by mouth at bedtime.     amphetamine-dextroamphetamine (ADDERALL XR) 15 MG 24 hr capsule Take 15 mg by mouth every morning.     HYDROcodone-acetaminophen (NORCO/VICODIN) 5-325 MG per tablet Take 1 tablet by mouth 2 (two) times daily as needed for moderate pain or severe pain.     hydroxychloroquine (PLAQUENIL) 200 MG tablet Take 100-200 mg by mouth 2 (two) times daily. Take 1 tablet (200 mg) in the morning & Take 1/2 tablet (100 mg) at bedtime     meloxicam (MOBIC) 15 MG tablet Take 15 mg by mouth  daily.     No current facility-administered medications for this visit.   No results found.  Review of Systems:   A ROS was performed including pertinent positives and negatives as documented in the HPI.  Physical Exam :   Constitutional: NAD and appears stated age Neurological: Alert and oriented Psych: Appropriate affect and cooperative Last menstrual period 03/23/2015.   Comprehensive Musculoskeletal Exam:    Musculoskeletal Exam    Inspection Right Left  Skin No atrophy or winging No atrophy or winging  Palpation    Tenderness Glenohumeral Glenohumeral  Range of Motion    Flexion (passive) 170 140  Flexion (active) 170 140  Abduction 170 130  ER at the side 70 25  Can reach behind back to T12 L3  Strength     Full Full with pain  Special Tests    Pseudoparalytic No No  Neurologic    Fires PIN, radial, median, ulnar, musculocutaneous, axillary, suprascapular, long thoracic, and spinal accessory innervated muscles. No abnormal sensibility  Vascular/Lymphatic    Radial Pulse 2+ 2+  Cervical Exam    Patient has symmetric cervical range of motion with negative Spurling's test.  Special Test:      Imaging:   Xray (3 views left shoulder): Significant glenohumeral osteoarthritis without humeral head elevation  MRI (left shoulder): Severe glenohumeral osteoarthritis with an intact rotator cuff and significant posterior humeral migration  I personally reviewed and interpreted the radiographs.   Assessment:   61 y.o. female right-hand-dominant presents with left shoulder pain in the setting of severe glenohumeral osteoarthritis which has been ongoing now for several years.  She has trialed multiple injections at today's visit and does not get any type of long-lasting relief.  I did discuss additional nonoperative versus operative treatment measures.  We did discuss physical therapy structured although at this time she is having very significant pain with her at home  program and I do not believe that this is likely to give her longer lasting pain relief given the severity of her glenohumeral osteoarthritis.  I did discuss additional injections although she is no longer getting relief from these and as result is hoping to defer these.  Given the severity of the arthritis I do not believe that she would necessarily benefit long-term from additional injections like PRP.  Side effects we did discuss shoulder arthroplasty.  I did discuss that with her significant posterior humeral head subluxation I believe that a reverse  shoulder arthroplasty would likely provide her the most durable result.  We did discuss the specific complications associated with this.  She would like some additional time to think about her treatment options and she will contact the office when she is further decided  Plan :    -Plan for likely left shoulder reverse shoulder arthroplasty     I personally saw and evaluated the patient, and participated in the management and treatment plan.  Vanetta Mulders, MD Attending Physician, Orthopedic Surgery  This document was dictated using Dragon voice recognition software. A reasonable attempt at proof reading has been made to minimize errors.

## 2021-08-31 NOTE — Telephone Encounter (Signed)
Patient called asking if the Lumbar Spine MRI could be reordered for her. She said that she was able to be seen for her shoulder and has a plan for that, so that this point so would like to see what is going on with her lower back.  Please advise.

## 2021-09-01 NOTE — Telephone Encounter (Signed)
My response to the following phone note:  The order is still valid in the system.  We need to check on the authorization and inform the radiology location.  Beverly Dougherty can you please check authorization and inform location and patient?  Beverly Dougherty  \\\Original Message\\\ Patient called asking if the Lumbar Spine MRI could be reordered for her. She said that she was able to be seen for her shoulder and has a plan for that, so that this point so would like to see what is going on with her lower back.   Please advise.

## 2021-09-01 NOTE — Telephone Encounter (Signed)
Sent patient a Mychart message in regard to MRI and where to call and schedule. No Josem Kaufmann is needed per insurance

## 2021-09-13 ENCOUNTER — Telehealth: Payer: Self-pay | Admitting: Orthopaedic Surgery

## 2021-09-13 NOTE — Telephone Encounter (Signed)
Patient called asked why does she need to do a reverse replacement and not a full shoulder replacement? Patient said she is concerned about loosing range of motion.   Patient said she would like to have her CT scan scheduled. The number to contact patient is [phone]

## 2021-09-14 ENCOUNTER — Other Ambulatory Visit (HOSPITAL_BASED_OUTPATIENT_CLINIC_OR_DEPARTMENT_OTHER): Payer: Self-pay | Admitting: Orthopaedic Surgery

## 2021-09-14 DIAGNOSIS — M19012 Primary osteoarthritis, left shoulder: Secondary | ICD-10-CM

## 2021-09-14 NOTE — Telephone Encounter (Signed)
LMOM to return my call to answer her question

## 2021-10-06 ENCOUNTER — Other Ambulatory Visit: Payer: Self-pay

## 2021-10-06 ENCOUNTER — Telehealth: Payer: Self-pay | Admitting: *Deleted

## 2021-10-06 DIAGNOSIS — M48062 Spinal stenosis, lumbar region with neurogenic claudication: Secondary | ICD-10-CM

## 2021-10-06 NOTE — Telephone Encounter (Signed)
Pt called stating she would like to have the MRI lumbar done now. There was an order put in on 5/31 but [facility] will need a new order please.

## 2021-10-17 ENCOUNTER — Ambulatory Visit (INDEPENDENT_AMBULATORY_CARE_PROVIDER_SITE_OTHER): Payer: No Typology Code available for payment source

## 2021-10-17 DIAGNOSIS — M48062 Spinal stenosis, lumbar region with neurogenic claudication: Secondary | ICD-10-CM | POA: Diagnosis not present

## 2021-10-19 NOTE — Progress Notes (Signed)
Lumbar spine MRI shows multiple levels that could produce back pain and pinched nerves in her back.  There are multiple targets for potential injections.  Recommend return to clinic to go over results in full detail and talk about treatment plan and options.

## 2021-10-24 ENCOUNTER — Ambulatory Visit (INDEPENDENT_AMBULATORY_CARE_PROVIDER_SITE_OTHER): Payer: No Typology Code available for payment source | Admitting: Family Medicine

## 2021-10-24 ENCOUNTER — Ambulatory Visit: Payer: Self-pay

## 2021-10-24 VITALS — BP 118/76 | Ht 63.0 in | Wt 134.0 lb

## 2021-10-24 DIAGNOSIS — M48062 Spinal stenosis, lumbar region with neurogenic claudication: Secondary | ICD-10-CM | POA: Diagnosis not present

## 2021-10-24 DIAGNOSIS — M47818 Spondylosis without myelopathy or radiculopathy, sacral and sacrococcygeal region: Secondary | ICD-10-CM

## 2021-10-24 NOTE — Patient Instructions (Addendum)
Thank you for coming in today.   You received an injection today. Seek immediate medical attention if the joint becomes red, extremely painful, or is oozing fluid.   You just had SI joint injection.   If not better next step is Facet injection. Let me know

## 2021-10-24 NOTE — Progress Notes (Unsigned)
   I, [name], LAT, ATC acting as a scribe for [name], MD.  Beverly Dougherty is a 61 y.o. female who presents to [place] at [facility] today for f/u LBP w/ MRI review. Of note, pt has RA and is currently being treated by [facility]. Pt was last seen 07/22/21 for chronic L shoulder pain. Pt's last visit regarding her LBP was on 07/06/21 and a MRI was ordered, but never obtained. Pt called the office on 10/06/21 requesting a lumbar MRI and an order was placed. Today, pt reports LBP is moving into varying locations. Pt is having pain deep w/in both buttock w/ radiating into either R or L leg varying. Pt c/o numbness in bilat anterior hip numbness. Pt stopped taking the Lyrica due to vision issues, but is wonder if that may explain why her LBP is bothersome.   Dx imaging: 10/17/21 L-spine MRI 10/26/20 L-spine XR  Pertinent review of systems: ***  Relevant historical information: ***   Exam:  LMP 03/23/2015  General: Well Developed, well nourished, and in no acute distress.   MSK: ***    Lab and Radiology Results No results found for this or any previous visit (from the past 72 hour(s)). No results found.     Assessment and Plan: 61 y.o. female with ***   PDMP not reviewed this encounter. No orders of the defined types were placed in this encounter.  No orders of the defined types were placed in this encounter.    Discussed warning signs or symptoms. Please see discharge instructions. Patient expresses understanding.   ***

## 2021-11-16 ENCOUNTER — Other Ambulatory Visit: Payer: Self-pay | Admitting: Family Medicine

## 2021-11-16 ENCOUNTER — Ambulatory Visit
Admission: RE | Admit: 2021-11-16 | Discharge: 2021-11-16 | Disposition: A | Discharge status: Home / Self Care | Payer: No Typology Code available for payment source | Source: Ambulatory Visit | Attending: Family Medicine | Admitting: Family Medicine

## 2021-11-16 DIAGNOSIS — R062 Wheezing: Secondary | ICD-10-CM

## 2021-11-16 DIAGNOSIS — R053 Chronic cough: Secondary | ICD-10-CM

## 2022-01-18 ENCOUNTER — Ambulatory Visit (INDEPENDENT_AMBULATORY_CARE_PROVIDER_SITE_OTHER): Payer: No Typology Code available for payment source | Admitting: Family Medicine

## 2022-01-18 VITALS — BP 150/96 | HR 93 | Ht 63.0 in | Wt 133.0 lb

## 2022-01-18 DIAGNOSIS — M48062 Spinal stenosis, lumbar region with neurogenic claudication: Secondary | ICD-10-CM

## 2022-01-18 NOTE — Patient Instructions (Signed)
Thank you for coming in today.   Take pregabilin. Think about taking it during the day.   I have referred you to Dr Davy Pique.   You should hear from him soon.

## 2022-01-18 NOTE — Progress Notes (Signed)
I, Beverly Dougherty, LAT, ATC acting as a scribe for Beverly Leader, MD.  Beverly Dougherty is a 61 y.o. female who presents to [place] at [facility] today for an exacerbation of her LBP. Of note, Dougherty has RA and is currently being treated by Beverly Dougherty was last seen by Beverly Dougherty on 10/24/21 and was given bilat SI joint steroid injections and to consider ESI or facet injections in the future. Today, Dougherty reports she has been in [place] for the past 3 weeks, as her dad is in hospice. Dougherty locates pain to deep within bilat buttocks w/ radiating pain to various locations. Prior SI joint steroid injects did provide some relief.   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   MSK: L-spine: Normal appearing nontender midline.  Decreased cervical motion.  Positive slump test and straight leg raise test. Lower extremity strength is intact.  Hips bilaterally normal-appearing Nontender ischial tuberosity.  Unable to reproduce pain bilateral ischial tuberosity with resisted hip extension or knee flexion.    Lab and Radiology Results   EXAM: MRI LUMBAR SPINE WITHOUT CONTRAST   TECHNIQUE: Multiplanar, multisequence MR imaging of the lumbar spine was performed. No intravenous contrast was administered.   COMPARISON:  X-ray 07/06/2021, MRI 11/20/2013   FINDINGS: Segmentation:  Standard.   Alignment: Lumbar dextrocurvature. 5 mm grade 1  anterolisthesis L3 on L4. Mild retrolisthesis at L1-2.   Vertebrae: No fracture, evidence of discitis, or bone lesion. Multilevel discogenic endplate marrow changes, most pronounced at the L1-2 level.   Conus medullaris and cauda equina: Conus extends to the L1 level. Conus appears normal. Bunching of the cauda equina nerve roots above the L3-4 level of stenosis.   Paraspinal and other soft tissues: Intramuscular edema within the lower right posterior paraspinal musculature. No acute findings.   Disc levels:   T10-T11: Sagittal sequences only. Disc height loss. Bilateral facet joint arthropathy with there appears to be at least mild bilateral foraminal stenosis. No canal stenosis.   T11-T12: Sagittal sequences only. Mild diffuse disc bulge and bilateral facet arthropathy with mild canal stenosis and mild right foraminal stenosis. Findings have progressed from prior.   T12-L1: No disc protrusion. No foraminal or canal stenosis. Unchanged.   L1-L2: Disc bulge and endplate ridging, eccentric to the left. Mild bilateral facet arthropathy. Mild canal and left subarticular recess stenosis. Moderate to severe left foraminal stenosis. Findings have progressed from prior.   L2-L3: Mild annular disc bulge. Borderline-mild canal stenosis. No foraminal stenosis. Findings have progressed from prior.   L3-L4: Anterolisthesis with disc uncovering and diffuse disc bulge. Moderate bilateral facet arthropathy. Moderate to severe canal stenosis with moderate to severe right and moderate left foraminal stenosis. Findings have progressed from prior.   L4-L5: Disc height loss with mild disc bulge and endplate ridging. Mild-moderate bilateral facet arthropathy. No canal stenosis. Mild right foraminal stenosis. Findings have progressed from prior.   L5-S1:  Disc height loss with disc bulge and endplate ridging, eccentric to the right. Mild bilateral facet arthropathy. Moderate to severe right  foraminal stenosis. No canal stenosis. Findings have progressed from prior.   IMPRESSION: 1. Progressive multilevel degenerative changes of the lumbar spine. Findings are most pronounced at the L3-4 level where there is moderate-to-severe canal stenosis with moderate-to-severe right and moderate left foraminal stenosis. 2. Moderate-to-severe right foraminal stenosis at L5-S1 and moderate-to-severe left foraminal stenosis at L1-L2. 3. Intramuscular edema within the lower right posterior paraspinal musculature, which may reflect muscle strain.     Electronically Signed   By: Beverly Dougherty D.O.   On: 10/18/2021 13:13 I, Beverly Dougherty, personally (independently) visualized and performed the interpretation of the images attached in this note.    Assessment and Plan: 61 y.o. female with buttocks pain bilaterally with bilateral lumbosacral radiculopathy at multiple levels.  Beverly Dougherty has radiculopathy at left L2 dermatomal pattern and bilateral S1 and some L4-L5 more on the right but some bilateral as well. This corresponds with her spinal stenosis and neuroforaminal stenosis seen on her lumbar spine MRI from September of this year.  She likely would benefit from epidural steroid injections at multiple levels.  I anticipate that she will require more than a few injections and would probably benefit from PMNR/pain management involvement.  Will refer to Dr. Davy Pique.  Additionally she has pain in the ischial tuberosity area bilaterally.  I am unable to reproduce this pain in clinic with resistive testing that would be typical for ischial bursitis or hamstring tendinitis.  I think her pain is mostly due to the radiculopathy and again would benefit from epidural steroid injections as above.   PDMP reviewed during this encounter. Orders Placed This Encounter  Procedures   Ambulatory referral to Pain Clinic    Referral Priority:   Routine    Referral Type:   Consultation    Referral Reason:    Specialty Services Required    Referred to Provider:   [name], MD    Requested Specialty:   Pain Medicine    Number of Visits Requested:   1   No orders of the defined types were placed in this encounter.    Discussed warning signs or symptoms. Please see discharge instructions. Patient expresses understanding.   The above documentation has been reviewed and is accurate and complete Beverly Dougherty, M.D.

## 2022-02-20 ENCOUNTER — Ambulatory Visit: Payer: Self-pay

## 2022-02-20 ENCOUNTER — Ambulatory Visit: Payer: No Typology Code available for payment source | Admitting: Family Medicine

## 2022-02-20 VITALS — BP 152/98 | HR 78 | Ht 63.0 in | Wt 134.0 lb

## 2022-02-20 DIAGNOSIS — M25512 Pain in left shoulder: Secondary | ICD-10-CM | POA: Diagnosis not present

## 2022-02-20 DIAGNOSIS — M25511 Pain in right shoulder: Secondary | ICD-10-CM | POA: Diagnosis not present

## 2022-02-20 DIAGNOSIS — G8929 Other chronic pain: Secondary | ICD-10-CM

## 2022-02-20 NOTE — Progress Notes (Signed)
I, Beverly Dougherty, LAT, ATC acting as a scribe for Beverly Leader, MD.  Beverly Dougherty is a 62 y.o. female who presents to [place] at [facility] today for continued bilat shoulder pain. Pt has a hx of RA. Patient's last visit with Dr. [name] on 01/18/2022 was for lumbar spinal stenosis.  Patient was seen for a visit on 07/22/2021 for her left shoulder and was given a left GH steroid injection, was referred to orthopedic surgery, and a DEXA scan was ordered.  Today, patient reports Dr. Davy Pique referred her to Dr. Arnoldo Morale. She was scheduled for a shoulder replacement in Nov, but had to r/s. Pt c/o bilat shoulder pain waking her up at night.   Dx imaging: 07/19/21 L shoulder MRI             07/06/21 L-spine XR             01/19/21 R & L shoulder XR  Pertinent review of systems: No fevers or chills  Relevant historical information: Shoulder DJD   Exam:  BP (!) 152/98   Pulse 78   Ht '5\' 3"'$  (1.6 m)   Wt 134 lb (60.8 kg)   LMP 03/23/2015   SpO2 98%   BMI 23.74 kg/m  General: Well Developed, well nourished, and in no acute distress.   MSK: Left shoulder decreased range of motion.  Pain with abduction. Right shoulder decreased range of motion pain with abduction.    Lab and Radiology Results  Procedure: Real-time Ultrasound Guided Injection of left shoulder glenohumeral joint posterior approach Device: Philips Affiniti 50G Images permanently stored and available for review in PACS Verbal informed consent obtained.  Discussed risks and benefits of procedure. Warned about infection, bleeding, hyperglycemia damage to structures among others. Patient expresses understanding and agreement Time-out conducted.   Noted no overlying erythema, induration, or other signs of local infection.   Skin prepped in a sterile fashion.   Local anesthesia: Topical Ethyl chloride.   With sterile technique and under real time ultrasound guidance: 40 mg of Kenalog and 2 mL of Marcaine  injected into glenohumeral joint. Fluid seen entering the joint capsule.   Completed without difficulty   Pain immediately resolved suggesting accurate placement of the medication.   Advised to call if fevers/chills, erythema, induration, drainage, or persistent bleeding.   Images permanently stored and available for review in the ultrasound unit.  Impression: Technically successful ultrasound guided injection.    Procedure: Real-time Ultrasound Guided Injection of right shoulder glenohumeral joint posterior approach Device: Philips Affiniti 50G Images permanently stored and available for review in PACS Verbal informed consent obtained.  Discussed risks and benefits of procedure. Warned about infection, bleeding, hyperglycemia damage to structures among others. Patient expresses understanding and agreement Time-out conducted.   Noted no overlying erythema, induration, or other signs of local infection.   Skin prepped in a sterile fashion.   Local anesthesia: Topical Ethyl chloride.   With sterile technique and under real time ultrasound guidance: 40 mg of Kenalog and 2 mL Marcaine injected into joint capsule. Fluid seen entering the shoulder joint.   Completed without difficulty   Pain immediately resolved suggesting accurate placement of the medication.   Advised to call if fevers/chills, erythema, induration, drainage, or persistent bleeding.   Images permanently stored and available for review in the ultrasound unit.  Impression: Technically successful ultrasound guided injection.        Assessment and Plan: 62 y.o. female with bilateral shoulder pain.  Left  shoulder has considerable DJD right last.  Plan for bilateral injection glenohumeral joint today.  She will left total shoulder replacement in the near future.  Check back with me as needed.   PDMP not reviewed this encounter. Orders Placed This Encounter  Procedures   [place] LIMITED JOINT SPACE STRUCTURES UP LEFT(NO LINKED  CHARGES)    Order Specific Question:   Reason for Exam (SYMPTOM  OR DIAGNOSIS REQUIRED)    Answer:   left shoulder pain    Order Specific Question:   Preferred imaging location?    Answer:   [place]   No orders of the defined types were placed in this encounter.    Discussed warning signs or symptoms. Please see discharge instructions. Patient expresses understanding.   The above documentation has been reviewed and is accurate and complete Beverly Dougherty, M.D.

## 2022-02-20 NOTE — Patient Instructions (Addendum)
Thank you for coming in today.   Let me know what you need.   Get that shoulder fixed.

## 2022-04-25 ENCOUNTER — Other Ambulatory Visit: Payer: Self-pay | Admitting: Neurosurgery

## 2022-06-05 ENCOUNTER — Other Ambulatory Visit: Payer: Self-pay | Admitting: Neurosurgery

## 2022-06-12 NOTE — Pre-Procedure Instructions (Signed)
Surgical Instructions    Your procedure is scheduled on Thursday, May 16th.  Report to [facility] Main Entrance "A" at 05:30 A.M., then check in with the Admitting office.  Call this number if you have problems the morning of surgery:  [phone]  If you have any questions prior to your surgery date call [phone]: Open Monday-Friday 8am-4pm If you experience any cold or flu symptoms such as cough, fever, chills, shortness of breath, etc. between now and your scheduled surgery, please notify us at the above number.     Remember:  Do not eat or drink after midnight the night before your surgery      Take these medicines the morning of surgery with A SIP OF WATER  cetirizine (ZYRTEC)    If needed: Brimonidine Tartrate (LUMIFY) eye drops HYDROcodone-acetaminophen (NORCO/VICODIN)  Propylene Glycol (SYSTANE COMPLETE) eye drops   Follow provider's instructions regarding hydroxychloroquine (PLAQUENIL).  As of today, STOP taking any Aspirin (unless otherwise instructed by your surgeon) Aleve, Naproxen, Ibuprofen, Motrin, Advil, Goody's, BC's, all herbal medications, meloxicam (MOBIC), fish oil, and all vitamins.                     Do NOT Smoke (Tobacco/Vaping) for 24 hours prior to your procedure.  If you use a CPAP at night, you may bring your mask/headgear for your overnight stay.   Contacts, glasses, piercing's, hearing aid's, dentures or partials may not be worn into surgery, please bring cases for these belongings.    For patients admitted to the hospital, discharge time will be determined by your treatment team.   Patients discharged the day of surgery will not be allowed to drive home, and someone needs to stay with them for 24 hours.  SURGICAL WAITING ROOM VISITATION Patients having surgery or a procedure may have no more than 2 support people in the waiting area - these visitors may rotate.   Children under the age of 37 must have an adult with them who is not the  patient. If the patient needs to stay at the hospital during part of their recovery, the visitor guidelines for inpatient rooms apply. Pre-op nurse will coordinate an appropriate time for 1 support person to accompany patient in pre-op.  This support person may not rotate.   Please refer to the [facility] website for the visitor guidelines for Inpatients (after your surgery is over and you are in a regular room).     Day of Surgery: Take a shower with CHG soap. Do not wear jewelry or makeup Do not wear lotions, powders, perfumes, or deodorant. Do not shave 48 hours prior to surgery.   Do not bring valuables to the hospital. [facility] is not responsible for any belongings or valuables. Do not wear nail polish, gel polish, artificial nails, or any other type of covering on natural nails (fingers and toes) If you have artificial nails or gel coating that need to be removed by a nail salon, please have this removed prior to surgery. Artificial nails or gel coating may interfere with anesthesia's ability to adequately monitor your vital signs. Wear Clean/Comfortable clothing the morning of surgery Remember to brush your teeth WITH YOUR REGULAR TOOTHPASTE.   Please read over the following fact sheets that you were given.    If you received a COVID test during your pre-op visit  it is requested that you wear a mask when out in public, stay away from anyone that may not be feeling well and notify  your surgeon if you develop symptoms. If you have been in contact with anyone that has tested positive in the last 10 days please notify you surgeon.

## 2022-06-13 ENCOUNTER — Other Ambulatory Visit: Payer: Self-pay

## 2022-06-13 ENCOUNTER — Encounter (HOSPITAL_COMMUNITY)
Admission: RE | Admit: 2022-06-13 | Discharge: 2022-06-13 | Disposition: A | Discharge status: Home / Self Care | Payer: No Typology Code available for payment source | Source: Ambulatory Visit | Attending: Neurosurgery | Admitting: Neurosurgery

## 2022-06-13 ENCOUNTER — Encounter (HOSPITAL_COMMUNITY): Payer: Self-pay

## 2022-06-13 VITALS — BP 130/96 | HR 73 | Temp 97.6°F | Resp 17 | Ht 64.0 in | Wt 134.0 lb

## 2022-06-13 DIAGNOSIS — Z01818 Encounter for other preprocedural examination: Secondary | ICD-10-CM | POA: Insufficient documentation

## 2022-06-13 DIAGNOSIS — I10 Essential (primary) hypertension: Secondary | ICD-10-CM | POA: Diagnosis not present

## 2022-06-13 HISTORY — DX: Attention-deficit hyperactivity disorder, unspecified type: F90.9

## 2022-06-13 HISTORY — DX: Essential (primary) hypertension: I10

## 2022-06-13 LAB — BASIC METABOLIC PANEL
Anion gap: 11 (ref 5–15)
BUN: 17 mg/dL (ref 6–20)
CO2: 25 mmol/L (ref 22–32)
Calcium: 9.6 mg/dL (ref 8.9–10.3)
Chloride: 97 mmol/L — ABNORMAL LOW (ref 98–111)
Creatinine, Ser: 0.88 mg/dL (ref 0.44–1.00)
GFR, Estimated: >60 mL/min (ref >60)
Glucose, Bld: 107 mg/dL — ABNORMAL HIGH (ref 70–99)
Potassium: 4.3 mmol/L (ref 3.5–5.1)
Sodium: 133 mmol/L — ABNORMAL LOW (ref 135–145)

## 2022-06-13 LAB — TYPE AND SCREEN
ABO/RH(D): A NEG
Antibody Screen: NEGATIVE

## 2022-06-13 LAB — CBC
HCT: 40.9 % (ref 36.0–46.0)
Hemoglobin: 13.1 g/dL (ref 12.0–15.0)
MCH: 31.7 pg (ref 26.0–34.0)
MCHC: 32 g/dL (ref 30.0–36.0)
MCV: 99 fL (ref 80.0–100.0)
Platelets: 306 10*3/uL (ref 150–400)
RBC: 4.13 MIL/uL (ref 3.87–5.11)
RDW: 11.9 % (ref 11.5–15.5)
WBC: 5.9 10*3/uL (ref 4.0–10.5)
nRBC: 0 % (ref 0.0–0.2)

## 2022-06-13 LAB — SURGICAL PCR SCREEN
MRSA, PCR: NEGATIVE
Staphylococcus aureus: NEGATIVE

## 2022-06-13 NOTE — Progress Notes (Signed)
PCP - Dr. Mosetta Putt Cardiologist - denies  PPM/ICD - denies   Chest x-ray - 11/16/21 EKG - 06/13/22 Stress Test - denies ECHO - denies Cardiac Cath - denies  Sleep Study - denies   DM- denies  ASA/Blood Thinner Instructions: n/a   ERAS Protcol - no, NPO   COVID TEST- n/a   Anesthesia review: no  Patient denies shortness of breath, fever, cough and chest pain at PAT appointment   All instructions explained to the patient, with a verbal understanding of the material. Patient agrees to go over the instructions while at home for a better understanding.  The opportunity to ask questions was provided.

## 2022-06-21 NOTE — Anesthesia Preprocedure Evaluation (Signed)
Anesthesia Evaluation  Patient identified by MRN, date of birth, ID band Patient awake    Reviewed: Allergy & Precautions, NPO status , Patient's Chart, lab work & pertinent test results  Airway Mallampati: II  TM Distance: >3 FB Neck ROM: Full    Dental no notable dental hx. (+) Teeth Intact, Dental Advisory Given, Implants   Pulmonary former smoker   Pulmonary exam normal breath sounds clear to auscultation       Cardiovascular Exercise Tolerance: Good hypertension, Normal cardiovascular exam Rhythm:Regular Rate:Normal     Neuro/Psych negative neurological ROS  negative psych ROS   GI/Hepatic negative GI ROS, Neg liver ROS,,,  Endo/Other  negative endocrine ROS    Renal/GU Lab Results      Component                Value               Date                      CREATININE               0.88                06/13/2022                BUN                      17                  06/13/2022                NA                       133 (L)             06/13/2022                K                        4.3                 06/13/2022                negative genitourinary   Musculoskeletal  (+) Arthritis , Rheumatoid disorders,    Abdominal   Peds  Hematology Lab Results      Component                Value               Date                      WBC                      5.9                 06/13/2022                HGB                      13.1                06/13/2022                HCT  40.9                06/13/2022                MCV                      99.0                06/13/2022                PLT                      306                 06/13/2022              Anesthesia Other Findings   Reproductive/Obstetrics                             Anesthesia Physical Anesthesia Plan  ASA: 2  Anesthesia Plan: General   Post-op Pain Management: Ketamine IV* and Ofirmev  IV (intra-op)*   Induction: Intravenous  PONV Risk Score and Plan: 4 or greater and Treatment may vary due to age or medical condition, Midazolam, Dexamethasone and Ondansetron  Airway Management Planned: Oral ETT  Additional Equipment: None  Intra-op Plan:   Post-operative Plan: Extubation in OR  Informed Consent: I have reviewed the patients History and Physical, chart, labs and discussed the procedure including the risks, benefits and alternatives for the proposed anesthesia with the patient or authorized representative who has indicated his/her understanding and acceptance.     Dental advisory given  Plan Discussed with: CRNA  Anesthesia Plan Comments:        Anesthesia Quick Evaluation

## 2022-06-22 ENCOUNTER — Other Ambulatory Visit: Payer: Self-pay

## 2022-06-22 ENCOUNTER — Ambulatory Visit (HOSPITAL_COMMUNITY): Payer: Commercial Managed Care - HMO

## 2022-06-22 ENCOUNTER — Ambulatory Visit (HOSPITAL_COMMUNITY)
Admission: RE | Admit: 2022-06-22 | Discharge: 2022-06-23 | Disposition: A | Discharge status: Home / Self Care | Payer: Commercial Managed Care - HMO | Source: Ambulatory Visit | Attending: Neurosurgery | Admitting: Neurosurgery

## 2022-06-22 ENCOUNTER — Ambulatory Visit (HOSPITAL_COMMUNITY): Payer: Commercial Managed Care - HMO | Admitting: Registered Nurse

## 2022-06-22 ENCOUNTER — Ambulatory Visit (HOSPITAL_COMMUNITY)
Admission: RE | Disposition: A | Discharge status: Home / Self Care | Payer: Self-pay | Source: Ambulatory Visit | Attending: Neurosurgery

## 2022-06-22 ENCOUNTER — Encounter (HOSPITAL_COMMUNITY): Payer: Self-pay | Admitting: Neurosurgery

## 2022-06-22 ENCOUNTER — Ambulatory Visit (HOSPITAL_BASED_OUTPATIENT_CLINIC_OR_DEPARTMENT_OTHER): Payer: Commercial Managed Care - HMO | Admitting: Registered Nurse

## 2022-06-22 DIAGNOSIS — M5116 Intervertebral disc disorders with radiculopathy, lumbar region: Secondary | ICD-10-CM | POA: Diagnosis not present

## 2022-06-22 DIAGNOSIS — M4316 Spondylolisthesis, lumbar region: Secondary | ICD-10-CM

## 2022-06-22 DIAGNOSIS — M48062 Spinal stenosis, lumbar region with neurogenic claudication: Secondary | ICD-10-CM

## 2022-06-22 DIAGNOSIS — M069 Rheumatoid arthritis, unspecified: Secondary | ICD-10-CM | POA: Insufficient documentation

## 2022-06-22 DIAGNOSIS — M4180 Other forms of scoliosis, site unspecified: Secondary | ICD-10-CM | POA: Diagnosis not present

## 2022-06-22 DIAGNOSIS — F909 Attention-deficit hyperactivity disorder, unspecified type: Secondary | ICD-10-CM | POA: Insufficient documentation

## 2022-06-22 DIAGNOSIS — M1990 Unspecified osteoarthritis, unspecified site: Secondary | ICD-10-CM | POA: Insufficient documentation

## 2022-06-22 DIAGNOSIS — Z87891 Personal history of nicotine dependence: Secondary | ICD-10-CM

## 2022-06-22 DIAGNOSIS — I10 Essential (primary) hypertension: Secondary | ICD-10-CM

## 2022-06-22 DIAGNOSIS — Z79899 Other long term (current) drug therapy: Secondary | ICD-10-CM | POA: Diagnosis not present

## 2022-06-22 LAB — ABO/RH: ABO/RH(D): A NEG

## 2022-06-22 SURGERY — POSTERIOR LUMBAR FUSION 1 LEVEL
Anesthesia: General

## 2022-06-22 MED ORDER — MIDAZOLAM HCL 2 MG/2ML IJ SOLN
INTRAMUSCULAR | Status: AC
  Filled 2022-06-22: qty 2

## 2022-06-22 MED ORDER — ONDANSETRON HCL 4 MG PO TABS: 4.0000 mg | ORAL_TABLET | Freq: Four times a day (QID) | ORAL | Status: DC | PRN

## 2022-06-22 MED ORDER — EPHEDRINE 5 MG/ML INJ
INTRAVENOUS | Status: AC
  Filled 2022-06-22: qty 5

## 2022-06-22 MED ORDER — LIDOCAINE 2% (20 MG/ML) 5 ML SYRINGE
INTRAMUSCULAR | Status: DC | PRN
  Administered 2022-06-22: 100 mg via INTRAVENOUS

## 2022-06-22 MED ORDER — BACITRACIN ZINC 500 UNIT/GM EX OINT
TOPICAL_OINTMENT | CUTANEOUS | Status: DC | PRN
  Administered 2022-06-22: 1 via TOPICAL

## 2022-06-22 MED ORDER — ROCURONIUM BROMIDE 10 MG/ML (PF) SYRINGE
PREFILLED_SYRINGE | INTRAVENOUS | Status: DC | PRN
  Administered 2022-06-22 (×2): 50 mg via INTRAVENOUS

## 2022-06-22 MED ORDER — MENTHOL 3 MG MT LOZG: 1.0000 | LOZENGE | OROMUCOSAL | Status: DC | PRN

## 2022-06-22 MED ORDER — LIDOCAINE 2% (20 MG/ML) 5 ML SYRINGE
INTRAMUSCULAR | Status: AC
  Filled 2022-06-22: qty 5

## 2022-06-22 MED ORDER — CHLORHEXIDINE GLUCONATE CLOTH 2 % EX PADS: 6.0000 | MEDICATED_PAD | Freq: Once | CUTANEOUS | Status: DC

## 2022-06-22 MED ORDER — BUPIVACAINE LIPOSOME 1.3 % IJ SUSP
INTRAMUSCULAR | Status: AC
  Filled 2022-06-22: qty 20

## 2022-06-22 MED ORDER — SUGAMMADEX SODIUM 200 MG/2ML IV SOLN
INTRAVENOUS | Status: DC | PRN
  Administered 2022-06-22: 120 mg via INTRAVENOUS

## 2022-06-22 MED ORDER — HYDROCODONE-ACETAMINOPHEN 5-325 MG PO TABS: 1.0000 | ORAL_TABLET | ORAL | Status: DC | PRN

## 2022-06-22 MED ORDER — THROMBIN 5000 UNITS EX SOLR
CUTANEOUS | Status: AC
  Filled 2022-06-22: qty 5000

## 2022-06-22 MED ORDER — CYCLOBENZAPRINE HCL 10 MG PO TABS
10.0000 mg | ORAL_TABLET | Freq: Three times a day (TID) | ORAL | Status: DC | PRN
  Administered 2022-06-22 – 2022-06-23 (×2): 10 mg via ORAL
  Filled 2022-06-22 (×2): qty 1

## 2022-06-22 MED ORDER — ACETAMINOPHEN 10 MG/ML IV SOLN
INTRAVENOUS | Status: AC
  Filled 2022-06-22: qty 100

## 2022-06-22 MED ORDER — ONDANSETRON HCL 4 MG/2ML IJ SOLN
INTRAMUSCULAR | Status: AC
  Filled 2022-06-22: qty 2

## 2022-06-22 MED ORDER — ACETAMINOPHEN 10 MG/ML IV SOLN: 1000.0000 mg | Freq: Once | INTRAVENOUS | Status: DC | PRN

## 2022-06-22 MED ORDER — PROPOFOL 10 MG/ML IV BOLUS
INTRAVENOUS | Status: AC
  Filled 2022-06-22: qty 20

## 2022-06-22 MED ORDER — OXYCODONE HCL 5 MG/5ML PO SOLN: 5.0000 mg | Freq: Once | ORAL | Status: DC | PRN

## 2022-06-22 MED ORDER — DEXAMETHASONE SODIUM PHOSPHATE 10 MG/ML IJ SOLN
INTRAMUSCULAR | Status: AC
  Filled 2022-06-22: qty 1

## 2022-06-22 MED ORDER — PROPOFOL 1000 MG/100ML IV EMUL
INTRAVENOUS | Status: AC
  Filled 2022-06-22: qty 300

## 2022-06-22 MED ORDER — ACETAMINOPHEN 325 MG PO TABS
650.0000 mg | ORAL_TABLET | ORAL | Status: DC | PRN
  Filled 2022-06-22: qty 2

## 2022-06-22 MED ORDER — OXYCODONE HCL 5 MG PO TABS: 5.0000 mg | ORAL_TABLET | Freq: Once | ORAL | Status: DC | PRN

## 2022-06-22 MED ORDER — BISACODYL 10 MG RE SUPP: 10.0000 mg | Freq: Every day | RECTAL | Status: DC | PRN

## 2022-06-22 MED ORDER — HYDROMORPHONE HCL 1 MG/ML IJ SOLN
INTRAMUSCULAR | Status: DC | PRN
  Administered 2022-06-22: .5 mg via INTRAVENOUS

## 2022-06-22 MED ORDER — CEFAZOLIN SODIUM-DEXTROSE 2-4 GM/100ML-% IV SOLN
2.0000 g | INTRAVENOUS | Status: AC
  Administered 2022-06-22: 2 g via INTRAVENOUS
  Filled 2022-06-22: qty 100

## 2022-06-22 MED ORDER — ACETAMINOPHEN 10 MG/ML IV SOLN
1000.0000 mg | Freq: Once | INTRAVENOUS | Status: DC | PRN
Start: 2022-06-22 — End: 2022-06-22

## 2022-06-22 MED ORDER — IRBESARTAN 150 MG PO TABS
150.0000 mg | ORAL_TABLET | Freq: Every morning | ORAL | Status: DC
  Administered 2022-06-22 – 2022-06-23 (×2): 150 mg via ORAL
  Filled 2022-06-22 (×2): qty 1

## 2022-06-22 MED ORDER — ONDANSETRON HCL 4 MG/2ML IJ SOLN
4.0000 mg | Freq: Four times a day (QID) | INTRAMUSCULAR | Status: DC | PRN
  Administered 2022-06-22: 4 mg via INTRAVENOUS
  Filled 2022-06-22: qty 2

## 2022-06-22 MED ORDER — ACETAMINOPHEN 500 MG PO TABS
1000.0000 mg | ORAL_TABLET | Freq: Four times a day (QID) | ORAL | Status: AC
  Administered 2022-06-22 – 2022-06-23 (×4): 1000 mg via ORAL
  Filled 2022-06-22 (×4): qty 2

## 2022-06-22 MED ORDER — AMISULPRIDE (ANTIEMETIC) 5 MG/2ML IV SOLN
10.0000 mg | Freq: Once | INTRAVENOUS | Status: DC | PRN
Start: 2022-06-22 — End: 2022-06-22

## 2022-06-22 MED ORDER — ACETAMINOPHEN 10 MG/ML IV SOLN
INTRAVENOUS | Status: DC | PRN
  Administered 2022-06-22: 900 mg via INTRAVENOUS

## 2022-06-22 MED ORDER — HYDROMORPHONE HCL 1 MG/ML IJ SOLN
0.2500 mg | INTRAMUSCULAR | Status: DC | PRN
  Administered 2022-06-22 (×2): 0.5 mg via INTRAVENOUS

## 2022-06-22 MED ORDER — SODIUM CHLORIDE 0.9% FLUSH
3.0000 mL | Freq: Two times a day (BID) | INTRAVENOUS | Status: DC
  Administered 2022-06-22 (×2): 3 mL via INTRAVENOUS

## 2022-06-22 MED ORDER — LACTATED RINGERS IV SOLN: INTRAVENOUS | Status: DC

## 2022-06-22 MED ORDER — PREGABALIN 25 MG PO CAPS
50.0000 mg | ORAL_CAPSULE | Freq: Every day | ORAL | Status: DC
  Administered 2022-06-22: 50 mg via ORAL
  Filled 2022-06-22: qty 2

## 2022-06-22 MED ORDER — 0.9 % SODIUM CHLORIDE (POUR BTL) OPTIME
TOPICAL | Status: DC | PRN
  Administered 2022-06-22: 1000 mL

## 2022-06-22 MED ORDER — DOCUSATE SODIUM 100 MG PO CAPS
100.0000 mg | ORAL_CAPSULE | Freq: Two times a day (BID) | ORAL | Status: DC
  Administered 2022-06-22 – 2022-06-23 (×3): 100 mg via ORAL
  Filled 2022-06-22 (×3): qty 1

## 2022-06-22 MED ORDER — ALPRAZOLAM 0.5 MG PO TABS
1.0000 mg | ORAL_TABLET | Freq: Every day | ORAL | Status: DC
  Administered 2022-06-22: 1 mg via ORAL
  Filled 2022-06-22: qty 2

## 2022-06-22 MED ORDER — THROMBIN 5000 UNITS EX SOLR
OROMUCOSAL | Status: DC | PRN
  Administered 2022-06-22 (×2): 5 mL via TOPICAL

## 2022-06-22 MED ORDER — ORAL CARE MOUTH RINSE: 15.0000 mL | Freq: Once | OROMUCOSAL | Status: AC

## 2022-06-22 MED ORDER — DEXAMETHASONE SODIUM PHOSPHATE 10 MG/ML IJ SOLN
INTRAMUSCULAR | Status: DC | PRN
  Administered 2022-06-22: 10 mg via INTRAVENOUS

## 2022-06-22 MED ORDER — HYDROMORPHONE HCL 1 MG/ML IJ SOLN: 0.2500 mg | INTRAMUSCULAR | Status: DC | PRN

## 2022-06-22 MED ORDER — MORPHINE SULFATE (PF) 4 MG/ML IV SOLN: 4.0000 mg | INTRAVENOUS | Status: DC | PRN

## 2022-06-22 MED ORDER — OXYCODONE HCL 5 MG/5ML PO SOLN
5.0000 mg | Freq: Once | ORAL | Status: DC | PRN
Start: 2022-06-22 — End: 2022-06-22

## 2022-06-22 MED ORDER — ACETAMINOPHEN 650 MG RE SUPP: 650.0000 mg | RECTAL | Status: DC | PRN

## 2022-06-22 MED ORDER — PHENYLEPHRINE 80 MCG/ML (10ML) SYRINGE FOR IV PUSH (FOR BLOOD PRESSURE SUPPORT)
PREFILLED_SYRINGE | INTRAVENOUS | Status: AC
  Filled 2022-06-22: qty 10

## 2022-06-22 MED ORDER — CEFAZOLIN SODIUM-DEXTROSE 2-4 GM/100ML-% IV SOLN
2.0000 g | Freq: Three times a day (TID) | INTRAVENOUS | Status: AC
  Administered 2022-06-22 (×2): 2 g via INTRAVENOUS
  Filled 2022-06-22 (×2): qty 100

## 2022-06-22 MED ORDER — HYDROMORPHONE HCL 1 MG/ML IJ SOLN
INTRAMUSCULAR | Status: AC
  Filled 2022-06-22: qty 1

## 2022-06-22 MED ORDER — KETAMINE HCL 50 MG/5ML IJ SOSY
PREFILLED_SYRINGE | INTRAMUSCULAR | Status: AC
  Filled 2022-06-22: qty 5

## 2022-06-22 MED ORDER — PHENYLEPHRINE HCL-NACL 20-0.9 MG/250ML-% IV SOLN
INTRAVENOUS | Status: DC | PRN
  Administered 2022-06-22: 50 ug/min via INTRAVENOUS

## 2022-06-22 MED ORDER — LIDOCAINE-EPINEPHRINE 1 %-1:100000 IJ SOLN
INTRAMUSCULAR | Status: DC | PRN
  Administered 2022-06-22: 10 mL

## 2022-06-22 MED ORDER — OXYCODONE HCL 5 MG PO TABS: 5.0000 mg | ORAL_TABLET | ORAL | Status: DC | PRN

## 2022-06-22 MED ORDER — CEFAZOLIN SODIUM-DEXTROSE 2-4 GM/100ML-% IV SOLN: 2.0000 g | INTRAVENOUS | Status: DC

## 2022-06-22 MED ORDER — HYDROXYCHLOROQUINE SULFATE 200 MG PO TABS
200.0000 mg | ORAL_TABLET | Freq: Every day | ORAL | Status: DC
  Administered 2022-06-22: 200 mg via ORAL
  Filled 2022-06-22: qty 1

## 2022-06-22 MED ORDER — KETAMINE HCL 10 MG/ML IJ SOLN
INTRAMUSCULAR | Status: DC | PRN
  Administered 2022-06-22 (×3): 10 mg via INTRAVENOUS
  Administered 2022-06-22: 20 mg via INTRAVENOUS

## 2022-06-22 MED ORDER — HYDROMORPHONE HCL 1 MG/ML IJ SOLN
INTRAMUSCULAR | Status: AC
  Filled 2022-06-22: qty 0.5

## 2022-06-22 MED ORDER — PHENYLEPHRINE HCL-NACL 20-0.9 MG/250ML-% IV SOLN
INTRAVENOUS | Status: AC
  Filled 2022-06-22: qty 250

## 2022-06-22 MED ORDER — SURGIPHOR WOUND IRRIGATION SYSTEM - OPTIME: TOPICAL | Status: DC | PRN

## 2022-06-22 MED ORDER — ONDANSETRON HCL 4 MG/2ML IJ SOLN
INTRAMUSCULAR | Status: DC | PRN
  Administered 2022-06-22: 4 mg via INTRAVENOUS

## 2022-06-22 MED ORDER — PHENYLEPHRINE HCL (PRESSORS) 10 MG/ML IV SOLN
INTRAVENOUS | Status: DC | PRN
  Administered 2022-06-22: 160 ug via INTRAVENOUS

## 2022-06-22 MED ORDER — ZOLPIDEM TARTRATE 5 MG PO TABS: 5.0000 mg | ORAL_TABLET | Freq: Every evening | ORAL | Status: DC | PRN

## 2022-06-22 MED ORDER — SODIUM CHLORIDE 0.9% FLUSH: 3.0000 mL | INTRAVENOUS | Status: DC | PRN

## 2022-06-22 MED ORDER — AMPHETAMINE-DEXTROAMPHET ER 5 MG PO CP24: 15.0000 mg | ORAL_CAPSULE | Freq: Two times a day (BID) | ORAL | Status: DC

## 2022-06-22 MED ORDER — LORATADINE 10 MG PO TABS
10.0000 mg | ORAL_TABLET | Freq: Every day | ORAL | Status: DC
  Administered 2022-06-23: 10 mg via ORAL
  Filled 2022-06-22: qty 1

## 2022-06-22 MED ORDER — ONDANSETRON HCL 4 MG/2ML IJ SOLN
4.0000 mg | Freq: Once | INTRAMUSCULAR | Status: DC | PRN
Start: 2022-06-22 — End: 2022-06-22

## 2022-06-22 MED ORDER — OXYCODONE HCL 5 MG PO TABS
5.0000 mg | ORAL_TABLET | Freq: Once | ORAL | Status: DC | PRN
Start: 2022-06-22 — End: 2022-06-22

## 2022-06-22 MED ORDER — ROCURONIUM BROMIDE 10 MG/ML (PF) SYRINGE
PREFILLED_SYRINGE | INTRAVENOUS | Status: AC
  Filled 2022-06-22: qty 10

## 2022-06-22 MED ORDER — LIDOCAINE-EPINEPHRINE 1 %-1:100000 IJ SOLN
INTRAMUSCULAR | Status: AC
  Filled 2022-06-22: qty 1

## 2022-06-22 MED ORDER — ONDANSETRON HCL 4 MG/2ML IJ SOLN: 4.0000 mg | Freq: Once | INTRAMUSCULAR | Status: DC | PRN

## 2022-06-22 MED ORDER — AMISULPRIDE (ANTIEMETIC) 5 MG/2ML IV SOLN: 10.0000 mg | Freq: Once | INTRAVENOUS | Status: DC | PRN

## 2022-06-22 MED ORDER — CHLORHEXIDINE GLUCONATE 0.12 % MT SOLN
15.0000 mL | Freq: Once | OROMUCOSAL | Status: AC
  Administered 2022-06-22: 15 mL via OROMUCOSAL
  Filled 2022-06-22: qty 15

## 2022-06-22 MED ORDER — PROPOFOL 10 MG/ML IV BOLUS
INTRAVENOUS | Status: DC | PRN
  Administered 2022-06-22: 120 mg via INTRAVENOUS

## 2022-06-22 MED ORDER — DIPHENHYDRAMINE HCL 50 MG/ML IJ SOLN
25.0000 mg | Freq: Once | INTRAMUSCULAR | Status: AC
  Administered 2022-06-22: 25 mg via INTRAVENOUS
  Filled 2022-06-22: qty 1

## 2022-06-22 MED ORDER — BUPIVACAINE LIPOSOME 1.3 % IJ SUSP
INTRAMUSCULAR | Status: DC | PRN
  Administered 2022-06-22: 20 mL

## 2022-06-22 MED ORDER — OXYCODONE HCL 5 MG PO TABS
10.0000 mg | ORAL_TABLET | ORAL | Status: DC | PRN
  Administered 2022-06-22 – 2022-06-23 (×6): 10 mg via ORAL
  Filled 2022-06-22 (×6): qty 2

## 2022-06-22 MED ORDER — MIDAZOLAM HCL 2 MG/2ML IJ SOLN
INTRAMUSCULAR | Status: DC | PRN
  Administered 2022-06-22: 2 mg via INTRAVENOUS

## 2022-06-22 MED ORDER — DIPHENHYDRAMINE HCL 25 MG PO CAPS
25.0000 mg | ORAL_CAPSULE | Freq: Four times a day (QID) | ORAL | Status: DC | PRN
  Administered 2022-06-22 – 2022-06-23 (×3): 25 mg via ORAL
  Filled 2022-06-22 (×3): qty 1

## 2022-06-22 MED ORDER — BISACODYL 5 MG PO TBEC
5.0000 mg | DELAYED_RELEASE_TABLET | Freq: Every day | ORAL | Status: DC | PRN
  Administered 2022-06-22: 5 mg via ORAL
  Filled 2022-06-22: qty 1

## 2022-06-22 MED ORDER — FENTANYL CITRATE (PF) 250 MCG/5ML IJ SOLN
INTRAMUSCULAR | Status: AC
  Filled 2022-06-22: qty 5

## 2022-06-22 MED ORDER — SODIUM CHLORIDE 0.9 % IV SOLN
250.0000 mL | INTRAVENOUS | Status: DC
  Administered 2022-06-22: 250 mL via INTRAVENOUS

## 2022-06-22 MED ORDER — BACITRACIN ZINC 500 UNIT/GM EX OINT
TOPICAL_OINTMENT | CUTANEOUS | Status: AC
  Filled 2022-06-22: qty 28.35

## 2022-06-22 MED ORDER — PHENOL 1.4 % MT LIQD: 1.0000 | OROMUCOSAL | Status: DC | PRN

## 2022-06-22 MED ORDER — FENTANYL CITRATE (PF) 250 MCG/5ML IJ SOLN
INTRAMUSCULAR | Status: DC | PRN
  Administered 2022-06-22: 50 ug via INTRAVENOUS
  Administered 2022-06-22 (×2): 75 ug via INTRAVENOUS
  Administered 2022-06-22: 50 ug via INTRAVENOUS

## 2022-06-22 SURGICAL SUPPLY — 70 items
APL SKNCLS STERI-STRIP NONHPOA (GAUZE/BANDAGES/DRESSINGS) ×1
BAG COUNTER SPONGE SURGICOUNT (BAG) ×1 IMPLANT
BAG SPNG CNTER NS LX DISP (BAG) ×2
BASKET BONE COLLECTION (BASKET) ×1 IMPLANT
BENZOIN TINCTURE PRP APPL 2/3 (GAUZE/BANDAGES/DRESSINGS) ×1 IMPLANT
BLADE CLIPPER SURG (BLADE) IMPLANT
BUR MATCHSTICK NEURO 3.0 LAGG (BURR) ×1 IMPLANT
BUR PRECISION FLUTE 6.0 (BURR) ×1 IMPLANT
CAGE ALTERA 10X31X9-13 15D (Cage) IMPLANT
CANISTER SUCT 3000ML PPV (MISCELLANEOUS) ×1 IMPLANT
CAP LOCK DLX THRD (Cap) IMPLANT
CNTNR URN SCR LID CUP LEK RST (MISCELLANEOUS) ×1 IMPLANT
CONT SPEC 4OZ STRL OR WHT (MISCELLANEOUS) ×2
COVER BACK TABLE 60X90IN (DRAPES) ×1 IMPLANT
DRAPE C-ARM 42X72 X-RAY (DRAPES) ×2 IMPLANT
DRAPE HALF SHEET 40X57 (DRAPES) ×1 IMPLANT
DRAPE LAPAROTOMY 100X72X124 (DRAPES) ×1 IMPLANT
DRAPE SURG 17X23 STRL (DRAPES) ×4 IMPLANT
DRSG OPSITE POSTOP 4X6 (GAUZE/BANDAGES/DRESSINGS) ×1 IMPLANT
ELECT BLADE 4.0 EZ CLEAN MEGAD (MISCELLANEOUS) ×1
ELECT REM PT RETURN 9FT ADLT (ELECTROSURGICAL) ×1
ELECTRODE BLDE 4.0 EZ CLN MEGD (MISCELLANEOUS) ×1 IMPLANT
ELECTRODE REM PT RTRN 9FT ADLT (ELECTROSURGICAL) ×1 IMPLANT
EVACUATOR 1/8 PVC DRAIN (DRAIN) IMPLANT
GAUZE 4X4 16PLY ~~LOC~~+RFID DBL (SPONGE) ×1 IMPLANT
GLOVE BIO SURGEON STRL SZ 6 (GLOVE) ×1 IMPLANT
GLOVE BIO SURGEON STRL SZ7 (GLOVE) IMPLANT
GLOVE BIO SURGEON STRL SZ8 (GLOVE) ×2 IMPLANT
GLOVE BIO SURGEON STRL SZ8.5 (GLOVE) ×2 IMPLANT
GLOVE BIOGEL PI IND STRL 6.5 (GLOVE) ×1 IMPLANT
GLOVE BIOGEL PI IND STRL 7.5 (GLOVE) IMPLANT
GLOVE EXAM NITRILE XL STR (GLOVE) IMPLANT
GOWN STRL REUS W/ TWL LRG LVL3 (GOWN DISPOSABLE) ×1 IMPLANT
GOWN STRL REUS W/ TWL XL LVL3 (GOWN DISPOSABLE) ×2 IMPLANT
GOWN STRL REUS W/TWL 2XL LVL3 (GOWN DISPOSABLE) IMPLANT
GOWN STRL REUS W/TWL LRG LVL3 (GOWN DISPOSABLE) ×1
GOWN STRL REUS W/TWL XL LVL3 (GOWN DISPOSABLE) ×3
HEMOSTAT POWDER KIT SURGIFOAM (HEMOSTASIS) ×1 IMPLANT
KIT BASIN OR (CUSTOM PROCEDURE TRAY) ×1 IMPLANT
KIT GRAFTMAG DEL NEURO DISP (NEUROSURGERY SUPPLIES) IMPLANT
KIT POSITION SURG JACKSON T1 (MISCELLANEOUS) ×1 IMPLANT
KIT TURNOVER KIT B (KITS) ×1 IMPLANT
NDL HYPO 21X1.5 SAFETY (NEEDLE) IMPLANT
NEEDLE HYPO 21X1.5 SAFETY (NEEDLE) IMPLANT
NEEDLE HYPO 22GX1.5 SAFETY (NEEDLE) ×1 IMPLANT
NS IRRIG 1000ML POUR BTL (IV SOLUTION) ×1 IMPLANT
PACK LAMINECTOMY NEURO (CUSTOM PROCEDURE TRAY) ×1 IMPLANT
PAD ARMBOARD 7.5X6 YLW CONV (MISCELLANEOUS) ×3 IMPLANT
PATTIES SURGICAL .5 X1 (DISPOSABLE) IMPLANT
PUTTY DBM 10CC CALC GRAN (Putty) IMPLANT
ROD CREO DLX CVD 6.35X40 (Rod) IMPLANT
ROD CURVED TI 6.35X40 (Rod) ×2 IMPLANT
SCREW PA DLX CREO 7.5X55 (Screw) IMPLANT
SOL ELECTROSURG ANTI STICK (MISCELLANEOUS)
SOLUTION ELECTROSURG ANTI STCK (MISCELLANEOUS) ×1 IMPLANT
SOLUTION IRRIG SURGIPHOR (IV SOLUTION) ×1 IMPLANT
SPIKE FLUID TRANSFER (MISCELLANEOUS) ×1 IMPLANT
SPONGE NEURO XRAY DETECT 1X3 (DISPOSABLE) IMPLANT
SPONGE SURGIFOAM ABS GEL 100 (HEMOSTASIS) IMPLANT
SPONGE T-LAP 4X18 ~~LOC~~+RFID (SPONGE) IMPLANT
STRIP CLOSURE SKIN 1/2X4 (GAUZE/BANDAGES/DRESSINGS) ×1 IMPLANT
SUT VIC AB 1 CT1 18XBRD ANBCTR (SUTURE) ×2 IMPLANT
SUT VIC AB 1 CT1 8-18 (SUTURE) ×1
SUT VIC AB 2-0 CP2 18 (SUTURE) ×2 IMPLANT
SYR 20ML LL LF (SYRINGE) IMPLANT
TOWEL GREEN STERILE (TOWEL DISPOSABLE) ×1 IMPLANT
TOWEL GREEN STERILE FF (TOWEL DISPOSABLE) ×1 IMPLANT
TRAY FOLEY MTR SLVR 14FR STAT (SET/KITS/TRAYS/PACK) IMPLANT
TRAY FOLEY MTR SLVR 16FR STAT (SET/KITS/TRAYS/PACK) ×1 IMPLANT
WATER STERILE IRR 1000ML POUR (IV SOLUTION) ×1 IMPLANT

## 2022-06-22 NOTE — Anesthesia Postprocedure Evaluation (Signed)
Anesthesia Post Note  Patient: Beverly Dougherty  Procedure(s) Performed: Posterior Lumbar Interbody Fusion, Interbody Prothesis POSTERIOR INSTRUMENTATION Lumbar three-four     Patient location during evaluation: PACU Anesthesia Type: General Level of consciousness: awake and alert Pain management: pain level controlled Vital Signs Assessment: post-procedure vital signs reviewed and stable Respiratory status: spontaneous breathing, nonlabored ventilation, respiratory function stable and patient connected to nasal cannula oxygen Cardiovascular status: blood pressure returned to baseline and stable Postop Assessment: no apparent nausea or vomiting Anesthetic complications: no  No notable events documented.  Last Vitals:  Vitals:   06/22/22 1230 06/22/22 1305  BP: 103/89 122/84  Pulse: 95 77  Resp: 18 16  Temp:  (!) 36.4 C  SpO2: 98% (!) 89%    Last Pain:  Vitals:   06/22/22 1230  TempSrc:   PainSc: 7                  Trevor Iha

## 2022-06-22 NOTE — Progress Notes (Signed)
Orthopedic Tech Progress Note Patient Details:  Beverly Dougherty Mar 11, 1960 [id number] Dropped off LSO at desk Ortho Devices Type of Ortho Device: Lumbar corsett Ortho Device/Splint Location: back Ortho Device/Splint Interventions: Ordered      Stacie Templin A Mishal Probert 06/22/2022, 1:44 PM

## 2022-06-22 NOTE — Op Note (Signed)
Brief history: The patient is a 62 year old white female who is complaining of back and bilateral leg pain consistent with neurogenic claudication.  She has failed medical management.  She was worked up with a lumbar MRI and lumbar x-rays which demonstrated a degenerative scoliosis, lumbar spine listhesis, spinal stenosis, etc.  I discussed the various treatment options with her.  She has decided proceed with surgery.  Preoperative diagnosis: Adult degenerative scoliosis, lumbar spondylolisthesis, degenerative disc disease, spinal stenosis compressing both the L3 and the L4 nerve roots; lumbago; lumbar radiculopathy; neurogenic claudication  Postoperative diagnosis: Same  Procedure: Bilateral L3-4 laminotomy/foraminotomies/medial facetectomy to decompress the bilateral L3 and L4 nerve roots(the work required to do this was in addition to the work required to do the posterior lumbar interbody fusion because of the patient's spinal stenosis, facet arthropathy. Etc. requiring a wide decompression of the nerve roots.);  Right L3-4 transforaminal lumbar interbody fusion with local morselized autograft bone and Zimmer DBM; insertion of interbody prosthesis at L3-4 (globus peek expandable interbody prosthesis); posterior nonsegmental instrumentation from L3 to L4 with globus titanium pedicle screws and rods; posterior lateral arthrodesis at L3-4 with local morselized autograft bone and Zimmer DBM.  Surgeon: Dr. Delma Officer  Asst.: Hildred Priest, NP  Anesthesia: Gen. endotracheal  Estimated blood loss: 200 cc  Drains: None  Complications: None  Description of procedure: The patient was brought to the operating room by the anesthesia team. General endotracheal anesthesia was induced. The patient was turned to the prone position on the Wilson frame. The patient's lumbosacral region was then prepared with Betadine scrub and Betadine solution. Sterile drapes were applied.  I then injected the area to be  incised with Marcaine with epinephrine solution. I then used the scalpel to make a linear midline incision over the L3-4 interspace. I then used electrocautery to perform a bilateral subperiosteal dissection exposing the spinous process and lamina of L3-4. We then obtained intraoperative radiograph to confirm our location. We then inserted the Verstrac retractor to provide exposure.  I began the decompression by using the high speed drill to perform laminotomies at L3-4 bilaterally. We then used the Kerrison punches to widen the laminotomy and removed the ligamentum flavum at L3-4 bilaterally. We used the Kerrison punches to remove the medial facets at L3-4 bilaterally. We performed wide foraminotomies about the bilateral L3 and L4 nerve roots completing the decompression.  We now turned our attention to the posterior lumbar interbody fusion. I used a scalpel to incise the intervertebral disc at L3-4 bilaterally. I then performed a partial intervertebral discectomy at L3-4 bilaterally using the pituitary forceps. We prepared the vertebral endplates at L3-4 bilaterally for the fusion by removing the soft tissues with the curettes. We then used the trial spacers to pick the appropriate sized interbody prosthesis. We prefilled his prosthesis with a combination of local morselized autograft bone that we obtained during the decompression as well as Zimmer DBM. We inserted the prefilled prosthesis into the interspace at L3-4 from the right, we then turned and expanded the prosthesis. There was a good snug fit of the prosthesis in the interspace. We then filled and the remainder of the intervertebral disc space with local morselized autograft bone and Zimmer DBM. This completed the posterior lumbar interbody arthrodesis.  During the decompression and insertion of the prosthesis the assistant protected the thecal sac and nerve roots with the D'Errico retractor.  We now turned attention to the instrumentation. Under  fluoroscopic guidance we cannulated the bilateral L3 and L4 pedicles  with the bone probe. We then removed the bone probe. We then tapped the pedicle with a 6.5 millimeter tap. We then removed the tap. We probed inside the tapped pedicle with a ball probe to rule out cortical breaches. We then inserted a 7.5 x 55 millimeter pedicle screw into the L3 and L4 pedicles bilaterally under fluoroscopic guidance. We then palpated along the medial aspect of the pedicles to rule out cortical breaches. There were none. The nerve roots were not injured. We then connected the unilateral pedicle screws with a lordotic rod. We compressed the construct and secured the rod in place with the caps. We then tightened the caps appropriately. This completed the instrumentation from L3-4.  We now turned our attention to the posterior lateral arthrodesis at L3-4. We used the high-speed drill to decorticate the remainder of the facets, pars, transverse process at L3-4. We then applied a combination of local morselized autograft bone and Zimmer DBM over these decorticated posterior lateral structures. This completed the posterior lateral arthrodesis.  We then obtained hemostasis using bipolar electrocautery. We irrigated the wound out with Betadine solution. We inspected the thecal sac and nerve roots and noted they were well decompressed. We then removed the retractor.  We injected Exparel . We reapproximated patient's thoracolumbar fascia with interrupted #1 Vicryl suture. We reapproximated patient's subcutaneous tissue with interrupted 2-0 Vicryl suture. The reapproximated patient's skin with Steri-Strips and benzoin. The wound was then coated with bacitracin ointment. A sterile dressing was applied. The drapes were removed. The patient was subsequently returned to the supine position where they were extubated by the anesthesia team. He was then transported to the post anesthesia care unit in stable condition. All sponge instrument and  needle counts were reportedly correct at the end of this case.

## 2022-06-22 NOTE — Anesthesia Procedure Notes (Signed)
Procedure Name: Intubation Date/Time: 06/22/2022 7:46 AM  Performed by: Alwyn Ren, CRNAPre-anesthesia Checklist: Patient identified, Emergency Drugs available, Suction available and Patient being monitored Patient Re-evaluated:Patient Re-evaluated prior to induction Oxygen Delivery Method: Circle system utilized Preoxygenation: Pre-oxygenation with 100% oxygen Induction Type: IV induction Ventilation: Mask ventilation without difficulty Laryngoscope Size: Miller and 2 Grade View: Grade I Tube type: Oral Tube size: 7.0 mm Number of attempts: 1 Airway Equipment and Method: Stylet and Oral airway Placement Confirmation: ETT inserted through vocal cords under direct vision, positive ETCO2 and breath sounds checked- equal and bilateral Secured at: 22 cm Tube secured with: Tape Dental Injury: Teeth and Oropharynx as per pre-operative assessment

## 2022-06-22 NOTE — Transfer of Care (Signed)
Immediate Anesthesia Transfer of Care Note  Patient: Beverly Dougherty  Procedure(s) Performed: Posterior Lumbar Interbody Fusion, Interbody Prothesis POSTERIOR INSTRUMENTATION Lumbar three-four  Patient Location: PACU  Anesthesia Type:General  Level of Consciousness: awake, alert , oriented, and patient cooperative  Airway & Oxygen Therapy: Patient Spontanous Breathing and Patient connected to nasal cannula oxygen  Post-op Assessment: Report given to RN and Post -op Vital signs reviewed and stable  Post vital signs: Reviewed and stable  Last Vitals:  Vitals Value Taken Time  BP    Temp    Pulse    Resp    SpO2      Last Pain:  Vitals:   06/22/22 0639  TempSrc:   PainSc: 4       Patients Stated Pain Goal: 3 (06/22/22 0639)  Complications: No notable events documented.

## 2022-06-22 NOTE — H&P (Signed)
Subjective: The patient is a 62 year old white female was complained of back and leg pain consistent with neurogenic claudication.  She has failed medical management and was worked up with lumbar x-rays and lumbar MRI which demonstrated L3-4 spondylolisthesis and spinal stenosis.  I discussed the various treatment options with her.  She has decided proceed with surgery.  Past Medical History:  Diagnosis Date   ADHD (attention deficit hyperactivity disorder)    Hypertension    Insomnia    related to pain from osteoarthritis   Osteoarthritis    bilat thumbs and hips    Past Surgical History:  Procedure Laterality Date   BREAST ENHANCEMENT SURGERY     CATARACT EXTRACTION     CERVICAL FUSION     HERNIA REPAIR     RECONSTRUCTION OF EYELID     "eye lid lift" per pt   TONSILLECTOMY     URETHRAL FISTULA REPAIR     after childbirth   WISDOM TOOTH EXTRACTION      No Active Allergies  Social History   Tobacco Use   Smoking status: Former    Packs/day: 0.50    Years: 5.00    Additional pack years: 0.00    Total pack years: 2.50    Types: Cigarettes    Quit date: 09/25/1984    Years since quitting: 37.7   Smokeless tobacco: Never  Substance Use Topics   Alcohol use: Yes    Alcohol/week: 4.0 standard drinks of alcohol    Types: 4 Glasses of wine per week    Family History  Problem Relation Age of Onset   Colon cancer Neg Hx    Prior to Admission medications   Medication Sig Start Date End Date Taking? Authorizing Provider  ALPRAZolam Prudy Feeler) 1 MG tablet Take 1 mg by mouth at bedtime.   Yes [provider]  amphetamine-dextroamphetamine (ADDERALL XR) 15 MG 24 hr capsule Take 15 mg by mouth 2 (two) times daily. 08/31/20  Yes [provider]  Ascorbic Acid (VITAMIN C) 1000 MG tablet Take 1,000 mg by mouth daily.   Yes [provider]  Brimonidine Tartrate (LUMIFY) 0.025 % SOLN Place 1 drop into both eyes daily as needed (Five times a week).   Yes [provider]  cetirizine (ZYRTEC) 10 MG tablet Take 10 mg by mouth daily.   Yes [provider]  HYDROcodone-acetaminophen (NORCO/VICODIN) 5-325 MG per tablet Take 1 tablet by mouth 2 (two) times daily as needed for moderate pain or severe pain.   Yes [provider]  hydroxychloroquine (PLAQUENIL) 200 MG tablet Take 200 mg by mouth at bedtime.   Yes [provider]  irbesartan (AVAPRO) 150 MG tablet Take 150 mg by mouth every morning.   Yes [provider]  meloxicam (MOBIC) 15 MG tablet Take 15 mg by mouth daily.   Yes [provider]  Multiple Vitamins-Minerals (MULTIVITAMIN WITH MINERALS) tablet Take 1 tablet by mouth daily. Silver +   Yes [provider]  pregabalin (LYRICA) 50 MG capsule Take 50 mg by mouth at bedtime.   Yes [provider]  Propylene Glycol (SYSTANE COMPLETE) 0.6 % SOLN Place 1 drop into both eyes daily as needed (Dry eyes).   Yes [provider]     Review of Systems  Positive ROS: As above  All other systems have been reviewed and were otherwise negative with the exception of those mentioned in the HPI and as above.  Objective: Vital signs in last 24 hours:  Temp:  [98.5 F (36.9 C)] 98.5 F (36.9 C) (05/16 0623) Pulse Rate:  [84] 84 (05/16 0623) Resp:  [18] 18 (05/16 0623) BP: (104)/(84) 104/84 (05/16 0623) SpO2:  [95 %] 95 % (05/16 0623) Weight:  [59 kg] 59 kg (05/16 0623) Estimated body mass index is 22.31 kg/m as calculated from the following:   Height as of this encounter: 5\' 4"  (1.626 m).   Weight as of this encounter: 59 kg.   General Appearance: Alert Head: Normocephalic, without obvious abnormality, atraumatic Eyes: PERRL, conjunctiva/corneas clear, EOM's intact,    Ears: Normal  Throat: Normal  Neck: Supple, Back: unremarkable Lungs: Clear to auscultation bilaterally, respirations unlabored Heart: Regular rate and rhythm, no murmur, rub or gallop Abdomen: Soft,  non-tender Extremities: Diffuse arthritic change, decreased range of motion in her shoulders skin: unremarkable  NEUROLOGIC:   Mental status: alert and oriented,Motor Exam - grossly normal Sensory Exam - grossly normal Reflexes:  Coordination - grossly normal Gait - grossly normal Balance - grossly normal Cranial Nerves: I: smell Not tested  II: visual acuity  OS: Normal  OD: Normal   II: visual fields Full to confrontation  II: pupils Equal, round, reactive to light  III,VII: ptosis None  III,IV,VI: extraocular muscles  Full ROM  V: mastication Normal  V: facial light touch sensation  Normal  V,VII: corneal reflex  Present  VII: facial muscle function - upper  Normal  VII: facial muscle function - lower Normal  VIII: hearing Not tested  IX: soft palate elevation  Normal  IX,X: gag reflex Present  XI: trapezius strength  5/5  XI: sternocleidomastoid strength 5/5  XI: neck flexion strength  5/5  XII: tongue strength  Normal    Data Review Lab Results  Component Value Date   WBC 5.9 06/13/2022   HGB 13.1 06/13/2022   HCT 40.9 06/13/2022   MCV 99.0 06/13/2022   PLT 306 06/13/2022   Lab Results  Component Value Date   NA 133 (L) 06/13/2022   K 4.3 06/13/2022   CL 97 (L) 06/13/2022   CO2 25 06/13/2022   BUN 17 06/13/2022   CREATININE 0.88 06/13/2022   GLUCOSE 107 (H) 06/13/2022   No results found for: "INR", "PROTIME"  Assessment/Plan: Lumbar spinal stasis, lumbar spinal stenosis, lumbar facet arthropathy, neurogenic claudication, lumbar radiculopathy, lumbago: I have discussed the situation with the patient.  I reviewed her imaging studies with her and pointed out the abnormalities.  We have discussed the various treatment options including surgery.  I have described the surgical treatment option of an L3-4 decompression, instrumentation and fusion.  I have shown her surgical models.  I have given her surgical pamphlet.  We have discussed the risk, benefits,  alternatives, expected postop course, and likelihood of achieving our goals with surgery.  I have answered all her questions.  She has decided proceed with surgery.   Cristi Loron 06/22/2022 7:27 AM

## 2022-06-23 DIAGNOSIS — M4316 Spondylolisthesis, lumbar region: Secondary | ICD-10-CM | POA: Diagnosis not present

## 2022-06-23 MED ORDER — CYCLOBENZAPRINE HCL 10 MG PO TABS: 10.0000 mg | ORAL_TABLET | Freq: Three times a day (TID) | ORAL | 0 refills | Status: DC | PRN

## 2022-06-23 MED ORDER — OXYCODONE-ACETAMINOPHEN 5-325 MG PO TABS: 1.0000 | ORAL_TABLET | ORAL | 0 refills | Status: DC | PRN

## 2022-06-23 MED ORDER — DOCUSATE SODIUM 100 MG PO CAPS: 100.0000 mg | ORAL_CAPSULE | Freq: Two times a day (BID) | ORAL | 0 refills | Status: AC

## 2022-06-23 MED ORDER — MELOXICAM 15 MG PO TABS: 15.0000 mg | ORAL_TABLET | Freq: Every day | ORAL | Status: AC

## 2022-06-23 MED FILL — Thrombin For Soln 5000 Unit: CUTANEOUS | Qty: 5000 | Status: AC

## 2022-06-23 NOTE — Plan of Care (Signed)

## 2022-06-23 NOTE — Progress Notes (Signed)
Patient alert and oriented, voiding adequately, skin clean, dry and intact without evidence of skin break down, or symptoms of complications - no redness or edema noted, only slight tenderness at site.  Patient states pain is manageable at time of discharge. Patient has an appointment with MD in 3 weeks 

## 2022-06-23 NOTE — Evaluation (Signed)
Occupational Therapy Evaluation Patient Details Name: Beverly Dougherty MRN: [id number] DOB: 02/25/1960 Today's Date: 06/23/2022   History of Present Illness 62 yo female s/p PLIF L 3-4 5/17. PMH: ADHD, HTN, insomnia, OA, cervical surg   Clinical Impression   Patient evaluated by Occupational Therapy with no further acute OT needs identified. All education has been completed and the patient has no further questions. See below for any follow-up Occupational Therapy or equipment needs. OT to sign off. Thank you for referral.        Recommendations for follow up therapy are one component of a multi-disciplinary discharge planning process, led by the attending physician.  Recommendations may be updated based on patient status, additional functional criteria and insurance authorization.   Assistance Recommended at Discharge None  Patient can return home with the following      Functional Status Assessment  Patient has had a recent decline in their functional status and demonstrates the ability to make significant improvements in function in a reasonable and predictable amount of time.  Equipment Recommendations  BSC/3in1    Recommendations for Other Services       Precautions / Restrictions Precautions Precautions: Back Precaution Comments: back handout provided and reviewed for adls Required Braces or Orthoses: Spinal Brace Spinal Brace: Lumbar corset;Applied in sitting position      Mobility Bed Mobility Overal bed mobility: Modified Independent                  Transfers Overall transfer level: Modified independent                        Balance Overall balance assessment: Modified Independent                                         ADL either performed or assessed with clinical judgement   ADL Overall ADL's : Modified independent                                       General ADL Comments: educated on adls with back  precuations but able to complete  Back handout provided and reviewed adls in detail. Pt educated on: clothing between brace, never sleep in brace, set an alarm at night for medication, avoid sitting for long periods of time, correct bed positioning for sleeping, correct sequence for bed mobility, avoiding lifting more than 5 pounds and never wash directly over incision. All education is complete and patient indicates understanding.    Vision Baseline Vision/History: 1 Wears glasses Ability to See in Adequate Light: 0 Adequate Patient Visual Report: No change from baseline Additional Comments: reading glasses only     Perception     Praxis      Pertinent Vitals/Pain Pain Assessment Pain Assessment: Faces Faces Pain Scale: Hurts little more Pain Location: back Pain Descriptors / Indicators: Discomfort Pain Intervention(s): Limited activity within patient's tolerance, Premedicated before session, Repositioned     Hand Dominance Right   Extremity/Trunk Assessment Upper Extremity Assessment Upper Extremity Assessment: Defer to OT evaluation LUE Deficits / Details: arthritis in bil hands   Lower Extremity Assessment Lower Extremity Assessment: Overall WFL for tasks assessed   Cervical / Trunk Assessment Cervical / Trunk Assessment: Back Surgery   Communication Communication Communication: No difficulties   Cognition  Arousal/Alertness: Awake/alert Behavior During Therapy: WFL for tasks assessed/performed Overall Cognitive Status: Within Functional Limits for tasks assessed                                       General Comments  incision dry and intact at this time    Exercises     Shoulder Instructions      Home Living Family/patient expects to be discharged to:: Private residence Living Arrangements: Spouse/significant other Available Help at Discharge: Family;Friend(s);Available 24 hours/day Type of Home: House Home Access: Stairs to enter [employer] of Steps: 2 Entrance Stairs-Rails: None Home Layout: Two level;Able to live on main level with bedroom/bathroom Alternate Level Stairs-Number of Steps: 15 Alternate Level Stairs-Rails: Left Bathroom Shower/Tub: Walk-in shower   Bathroom Toilet: Handicapped height     Home Equipment: Agricultural consultant (2 wheels)   Additional Comments: has a 1 week old puppy that is in training until end of the month. Son can help with 2 dogs in the home      Prior Functioning/Environment Prior Level of Function : Independent/Modified Independent;Working/employed;Driving                        OT Problem List: Impaired balance (sitting and/or standing)      OT Treatment/Interventions:      OT Goals(Current goals can be found in the care plan section) Acute Rehab OT Goals Patient Stated Goal: to go home with a toilet seat Potential to Achieve Goals: Good  OT Frequency:      Co-evaluation              AM-PAC OT "6 Clicks" Daily Activity     Outcome Measure Help from another person eating meals?: None Help from another person taking care of personal grooming?: None Help from another person toileting, which includes using toliet, bedpan, or urinal?: None Help from another person bathing (including washing, rinsing, drying)?: None Help from another person to put on and taking off regular upper body clothing?: None Help from another person to put on and taking off regular lower body clothing?: None 6 Click Score: 24   End of Session Equipment Utilized During Treatment: Back brace Nurse Communication: Mobility status;Precautions  Activity Tolerance: Patient tolerated treatment well Patient left: Other (comment) (in hall with PT)  OT Visit Diagnosis: Muscle weakness (generalized) (M62.81)                Time: 1610-9604 OT Time Calculation (min): 29 min Charges:  OT General Charges $OT Visit: 1 Visit OT Evaluation $OT Eval Moderate Complexity: 1 Mod OT  Treatments $Self Care/Home Management : 8-22 mins   Brynn, OTR/L  Acute Rehabilitation Services Office: [phone] .   Mateo Flow 06/23/2022, 10:21 AM

## 2022-06-23 NOTE — Discharge Summary (Signed)
Physician Discharge Summary     Providing Compassionate, Quality Care - Together   Patient ID: Beverly Dougherty MRN: [id number] DOB/AGE: 60-Nov-1962 60 y.o.  Admit date: 06/22/2022 Discharge date: 06/23/2022  Admission Diagnoses: Spondylolisthesis of lumbar region  Discharge Diagnoses:  Principal Problem:   Spondylolisthesis of lumbar region   Discharged Condition: good  Hospital Course: Patient underwent an L3-4 PLIF by Dr. Lovell Sheehan on 06/22/2022. She was admitted to 3C02 following recovery from anesthesia in the PACU. Her postoperative course has been uncomplicated. She has worked with both physical and occupational therapies who feel the patient is ready for discharge home. She is ambulating independently and without difficulty. She is tolerating a normal diet. She is not having any bowel or bladder dysfunction. Her pain is well-controlled with oral pain medication. She is ready for discharge home.   Consults: PT/OT/TOC  Significant Diagnostic Studies: radiology: DG Lumbar Spine 2-3 Views  Result Date: 06/22/2022 CLINICAL DATA:  Lumbar fusion. EXAM: LUMBAR SPINE - 2-3 VIEW COMPARISON:  MR lumbar spine dated October 17, 2021. FLUOROSCOPY TIME:  Radiation Exposure Index (as provided by the fluoroscopic device): 7.8 mGy Kerma C-arm fluoroscopic images were obtained intraoperatively and submitted for post operative interpretation. FINDINGS: Frontal and lateral intraoperative fluoroscopic images demonstrate interval placement of L3-L4 PLIF hardware. Improved anterolisthesis at L3-L4. No acute osseous abnormality. IMPRESSION: 1. Intraoperative fluoroscopic guidance for L3-L4 PLIF. Electronically Signed   By: Obie Dredge M.D.   On: 06/22/2022 11:25   DG Lumbar Spine 1 View  Result Date: 06/22/2022 CLINICAL DATA:  Lumbar surgery. EXAM: LUMBAR SPINE - 1 VIEW COMPARISON:  MRI lumbar spine dated October 17, 2021. FINDINGS: Single lateral intraoperative x-ray demonstrates surgical  instrumentation between the L2 and L4 spinous processes. The L3 spinous process has been removed. Unchanged anterolisthesis at L3-L4 and advanced multilevel disc height loss, sparing L2-L3. IMPRESSION: 1. Surgical instrumentation posterior to L3. Electronically Signed   By: Obie Dredge M.D.   On: 06/22/2022 11:23   DG C-Arm 1-60 Min-No Report  Result Date: 06/22/2022 Fluoroscopy was utilized by the requesting physician.  No radiographic interpretation.     Treatments: surgery: Bilateral L3-4 laminotomy/foraminotomies/medial facetectomy to decompress the bilateral L3 and L4 nerve roots(the work required to do this was in addition to the work required to do the posterior lumbar interbody fusion because of the patient's spinal stenosis, facet arthropathy. Etc. requiring a wide decompression of the nerve roots.);  Right L3-4 transforaminal lumbar interbody fusion with local morselized autograft bone and Zimmer DBM; insertion of interbody prosthesis at L3-4 (globus peek expandable interbody prosthesis); posterior nonsegmental instrumentation from L3 to L4 with globus titanium pedicle screws and rods; posterior lateral arthrodesis at L3-4 with local morselized autograft bone and Zimmer DBM.   Discharge Exam: Blood pressure 106/67, pulse 93, temperature 98.6 F (37 C), temperature source Oral, resp. rate 18, height 5\' 4"  (1.626 m), weight 59 kg, last menstrual period 03/23/2015, SpO2 100 %.  Alert and oriented x 4 PERRLA CN II-XII grossly intact MAE, Strength and sensation intact Incision is covered with Honeycomb dressing and Steri Strips; Dressing is dry and intact, with small amount of dried blood   Disposition: Discharge disposition: 01-Home or Self Care       Discharge Instructions     Call MD for:  difficulty breathing, headache or visual disturbances   Complete by: As directed    Call MD for:  hives   Complete by: As directed    Call MD for:  persistant nausea and  vomiting    Complete by: As directed    Call MD for:  redness, tenderness, or signs of infection (pain, swelling, redness, odor or green/yellow discharge around incision site)   Complete by: As directed    Call MD for:  severe uncontrolled pain   Complete by: As directed    Call MD for:  temperature >100.4   Complete by: As directed    Diet - low sodium heart healthy   Complete by: As directed    If the dressing is still on your incision site when you go home, remove it on the third day after your surgery date. Remove dressing if it begins to fall off, or if it is dirty or damaged before the third day.   Complete by: As directed    Increase activity slowly   Complete by: As directed       Allergies as of 06/23/2022   No Active Allergies      Medication List     STOP taking these medications    HYDROcodone-acetaminophen 5-325 MG tablet Commonly known as: NORCO/VICODIN       TAKE these medications    ALPRAZolam 1 MG tablet Commonly known as: XANAX Take 1 mg by mouth at bedtime.   amphetamine-dextroamphetamine 15 MG 24 hr capsule Commonly known as: ADDERALL XR Take 15 mg by mouth 2 (two) times daily.   cetirizine 10 MG tablet Commonly known as: ZYRTEC Take 10 mg by mouth daily.   cyclobenzaprine 10 MG tablet Commonly known as: FLEXERIL Take 1 tablet (10 mg total) by mouth 3 (three) times daily as needed for muscle spasms.   docusate sodium 100 MG capsule Commonly known as: COLACE Take 1 capsule (100 mg total) by mouth 2 (two) times daily.   hydroxychloroquine 200 MG tablet Commonly known as: PLAQUENIL Take 200 mg by mouth at bedtime.   irbesartan 150 MG tablet Commonly known as: AVAPRO Take 150 mg by mouth every morning.   Lumify 0.025 % Soln Generic drug: Brimonidine Tartrate Place 1 drop into both eyes daily as needed (Five times a week).   meloxicam 15 MG tablet Commonly known as: MOBIC Take 1 tablet (15 mg total) by mouth daily. Can restart in two weeks What  changed: additional instructions   multivitamin with minerals tablet Take 1 tablet by mouth daily. Silver +   oxyCODONE-acetaminophen 5-325 MG tablet Commonly known as: Percocet Take 1-2 tablets by mouth every 4 (four) hours as needed for severe pain (postoperative pain).   pregabalin 50 MG capsule Commonly known as: LYRICA Take 50 mg by mouth at bedtime.   Systane Complete 0.6 % Soln Generic drug: Propylene Glycol Place 1 drop into both eyes daily as needed (Dry eyes).   vitamin C 1000 MG tablet Take 1,000 mg by mouth daily.               Discharge Care Instructions  (From admission, onward)           Start     Ordered   06/23/22 0000  If the dressing is still on your incision site when you go home, remove it on the third day after your surgery date. Remove dressing if it begins to fall off, or if it is dirty or damaged before the third day.        06/23/22 1610            Follow-up Information     Tressie Stalker, MD. Go on 07/18/2022.   Specialty: Neurosurgery  Why: First post op appointment with x-rays is on 07/18/2022 at 8:15 AM. Contact information: [address] [place] [address] [phone]                 Signed: Val Eagle, DNP, AGNP-C Nurse Practitioner  [facility] [address], [address], [place], [place] [address] P: [phone]    F: [phone]  06/23/2022, 9:55 AM

## 2022-06-23 NOTE — Discharge Instructions (Signed)
Wound Care Keep incision covered and dry until post op day 3. You may remove the Honeycomb dressing on post op day 3. Leave steri-strips on back.  They will fall off by themselves. Do not put any creams, lotions, or ointments on incision. You are fine to shower. Let water run over incision and pat dry.  Activity Walk each and every day, increasing distance each day. No lifting greater than 5 lbs.  Avoid excessive back motion. No driving for 2 weeks; may ride as a passenger locally.  Diet Resume your normal diet.   Return to Work Will be discussed at your follow up appointment.  Call Your Doctor If Any of These Occur Redness, drainage, or swelling at the wound.  Temperature greater than 101 degrees. Severe pain not relieved by pain medication. Incision starts to come apart.  Follow Up Appt Call [phone] today for appointment in 2-3 weeks if you don't already have one or for any problems.  If you have any hardware placed in your spine, you will need an x-ray before your appointment.  

## 2022-06-23 NOTE — Evaluation (Signed)
Physical Therapy Evaluation Patient Details Name: Beverly Dougherty MRN: [id number] DOB: 03-Jun-1960 Today's Date: 06/23/2022  History of Present Illness  62 yo female s/p PLIF L 3-4 5/17. PMH: ADHD, HTN, insomnia, OA, cervical surg  Clinical Impression  PT eval complete. Pt mod I in room mobility. Supervision provided for amb in hallway 350' without AD. Ascend/descend a flight of stairs with L rail supervision. All education complete. Plan is for d/c home today. PT signing off.       Recommendations for follow up therapy are one component of a multi-disciplinary discharge planning process, led by the attending physician.  Recommendations may be updated based on patient status, additional functional criteria and insurance authorization.  Follow Up Recommendations       Assistance Recommended at Discharge PRN  Patient can return home with the following       Equipment Recommendations None recommended by PT  Recommendations for Other Services       Functional Status Assessment Patient has had a recent decline in their functional status and demonstrates the ability to make significant improvements in function in a reasonable and predictable amount of time.     Precautions / Restrictions Precautions Precautions: Back Precaution Comments: back handout provided and reviewed for adls Required Braces or Orthoses: Spinal Brace Spinal Brace: Lumbar corset;Applied in sitting position      Mobility  Bed Mobility Overal bed mobility: Modified Independent                  Transfers Overall transfer level: Modified independent Equipment used: None                    Ambulation/Gait Ambulation/Gait assistance: Supervision Gait Distance (Feet): 350 Feet Assistive device: None Gait Pattern/deviations: Step-through pattern          Stairs Stairs: Yes Stairs assistance: Supervision Stair Management: One rail Left, Forwards, Alternating pattern Number of Stairs:  12    Wheelchair Mobility    Modified Rankin (Stroke Patients Only)       Balance Overall balance assessment: Modified Independent                                           Pertinent Vitals/Pain Pain Assessment Pain Assessment: Faces Faces Pain Scale: Hurts a little bit Pain Location: back Pain Descriptors / Indicators: Discomfort Pain Intervention(s): Monitored during session, Limited activity within patient's tolerance    Home Living Family/patient expects to be discharged to:: Private residence Living Arrangements: Spouse/significant other Available Help at Discharge: Family;Friend(s);Available 24 hours/day Type of Home: House Home Access: Stairs to enter Entrance Stairs-Rails: None Entrance Stairs-Number of Steps: 2 Alternate Level Stairs-Number of Steps: 15 Home Layout: Two level;Able to live on main level with bedroom/bathroom Home Equipment: Rolling Walker (2 wheels) Additional Comments: has a 61 week old puppy that is in training until end of the month. Son can help with 2 dogs in the home    Prior Function Prior Level of Function : Independent/Modified Independent;Working/employed;Driving                     Hand Dominance   Dominant Hand: Right    Extremity/Trunk Assessment   Upper Extremity Assessment Upper Extremity Assessment: Defer to OT evaluation LUE Deficits / Details: arthritis in bil hands    Lower Extremity Assessment Lower Extremity Assessment: Overall WFL for tasks assessed  Cervical / Trunk Assessment Cervical / Trunk Assessment: Back Surgery  Communication   Communication: No difficulties  Cognition Arousal/Alertness: Awake/alert Behavior During Therapy: WFL for tasks assessed/performed Overall Cognitive Status: Within Functional Limits for tasks assessed                                          General Comments General comments (skin integrity, edema, etc.): incision dry and intact at  this time    Exercises     Assessment/Plan    PT Assessment Patient does not need any further PT services  PT Problem List         PT Treatment Interventions      PT Goals (Current goals can be found in the Care Plan section)  Acute Rehab PT Goals Patient Stated Goal: home PT Goal Formulation: All assessment and education complete, DC therapy    Frequency       Co-evaluation               AM-PAC PT "6 Clicks" Mobility  Outcome Measure Help needed turning from your back to your side while in a flat bed without using bedrails?: None Help needed moving from lying on your back to sitting on the side of a flat bed without using bedrails?: None Help needed moving to and from a bed to a chair (including a wheelchair)?: None Help needed standing up from a chair using your arms (e.g., wheelchair or bedside chair)?: None Help needed to walk in hospital room?: A Little Help needed climbing 3-5 steps with a railing? : A Little 6 Click Score: 22    End of Session Equipment Utilized During Treatment: Back brace Activity Tolerance: Patient tolerated treatment well Patient left: in bed;with call bell/phone within reach Nurse Communication: Mobility status PT Visit Diagnosis: Difficulty in walking, not elsewhere classified (R26.2)    Time: 1308-6578 PT Time Calculation (min) (ACUTE ONLY): 21 min   Charges:   PT Evaluation $PT Eval Low Complexity: 1 Low          Ferd Glassing., PT  Office # [phone]   Ilda Foil 06/23/2022, 10:23 AM

## 2022-06-28 MED FILL — Sodium Chloride IV Soln 0.9%: INTRAVENOUS | Qty: 1000 | Status: AC

## 2022-06-28 MED FILL — Heparin Sodium (Porcine) Inj 1000 Unit/ML: INTRAMUSCULAR | Qty: 30 | Status: AC

## 2022-07-29 IMAGING — MR MR SHOULDER*L* W/O CM
6 series · 40 of 40 positions shown · non-contrast
Comparison: Left shoulder radiographs 01/19/2021

CLINICAL DATA: Chronic left shoulder pain. Osteoarthritis
suspected. Rotator cuff disorder suspected.

EXAM:
MRI OF THE LEFT SHOULDER WITHOUT CONTRAST
TECHNIQUE: Multiplanar, multisequence MR imaging of the shoulder was performed.
No intravenous contrast was administered.

[Series 3: T2 fat-sat · axial · 4.0mm · 0.55mm/px · z∈[-47,+44]mm · 6 of 20 slices shown (1 of 3)]
[im 1/20]
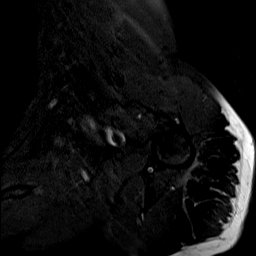
[im 4/20]
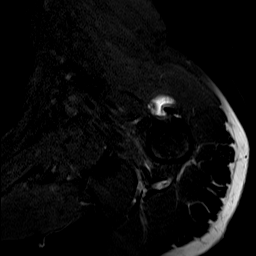
[im 8/20]
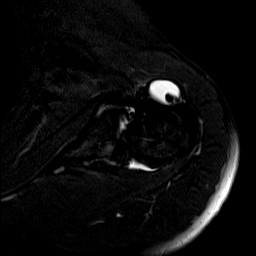
[im 12/20]
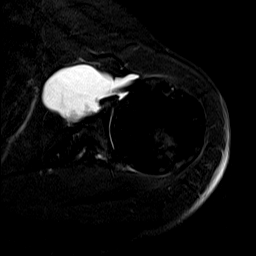
[im 16/20]
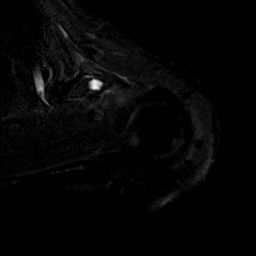
[im 20/20]
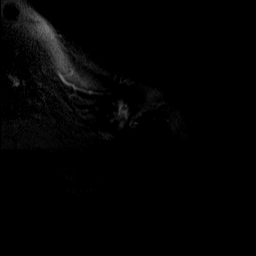

[Series 4: T2 fat-sat · oblique · 4.0mm · 0.55mm/px · 6 of 18 slices shown (2 of 3)]
[im 1/18]
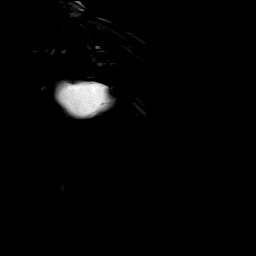
[im 4/18]
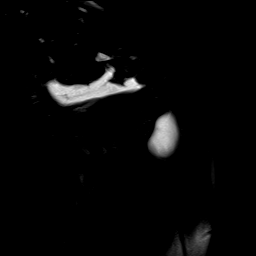
[im 7/18]
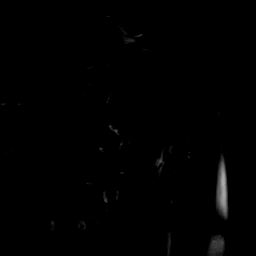
[im 11/18]
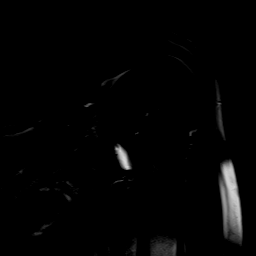
[im 14/18]
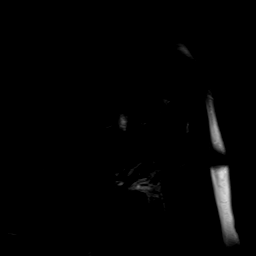
[im 18/18]
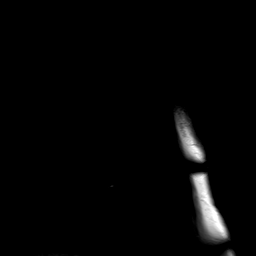

[Series 5: PD · oblique · 4.0mm · 0.55mm/px · 7 of 18 slices shown (1 of 2)]
[im 1/18]
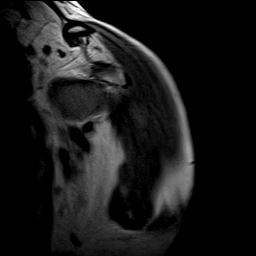
[im 3/18]
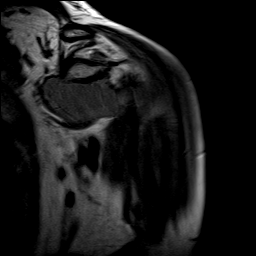
[im 6/18]
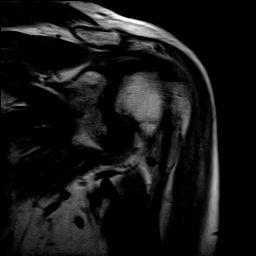
[im 9/18]
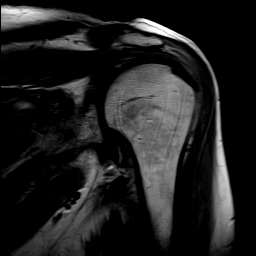
[im 12/18]
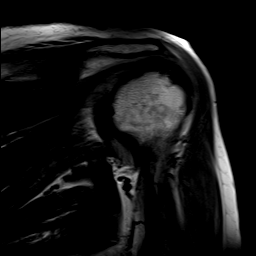
[im 15/18]
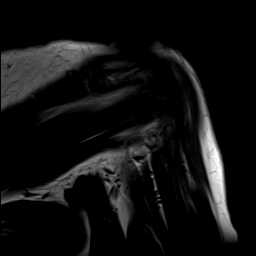
[im 18/18]
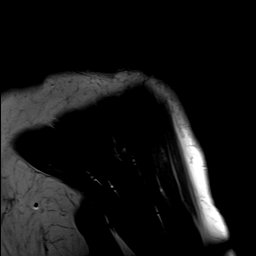

[Series 6: T2 fat-sat · oblique · 4.0mm · 0.55mm/px · 7 of 18 slices shown (3 of 3)]
[im 1/18]
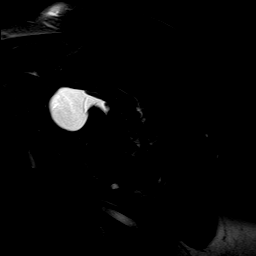
[im 3/18]
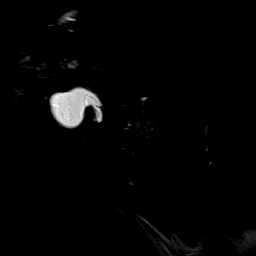
[im 6/18]
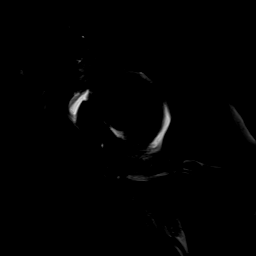
[im 9/18]
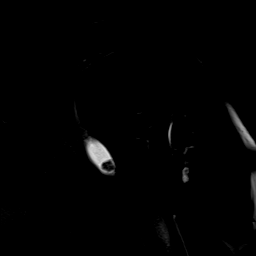
[im 12/18]
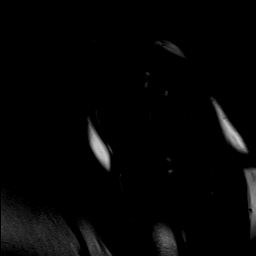
[im 15/18]
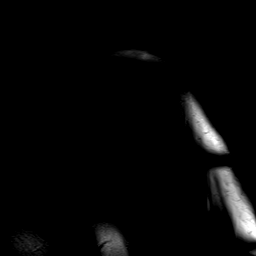
[im 18/18]
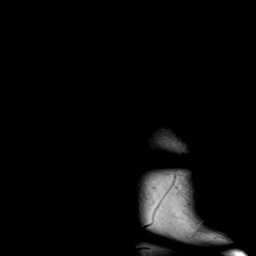

[Series 7: T1 · oblique · 4.0mm · 0.55mm/px · 7 of 18 slices shown]
[im 1/18]
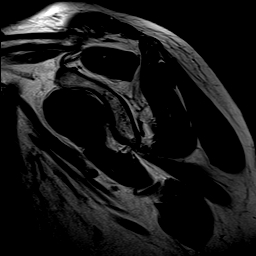
[im 3/18]
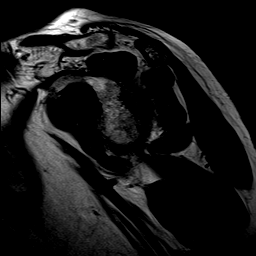
[im 6/18]
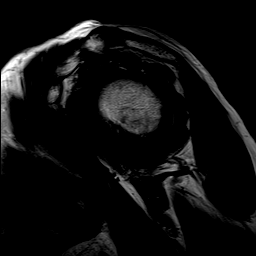
[im 9/18]
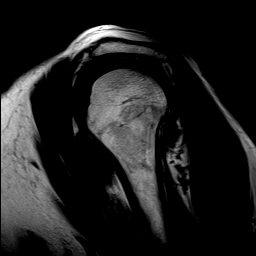
[im 12/18]
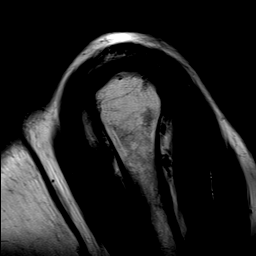
[im 15/18]
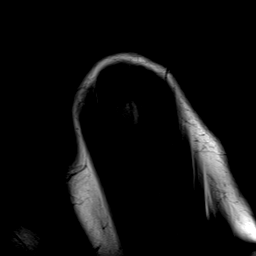
[im 18/18]
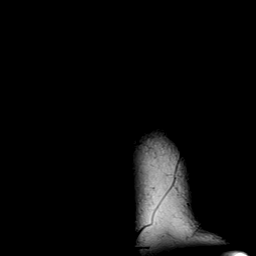

[Series 8: PD · oblique · 4.0mm · 0.55mm/px · 7 of 18 slices shown (2 of 2)]
[im 1/18]
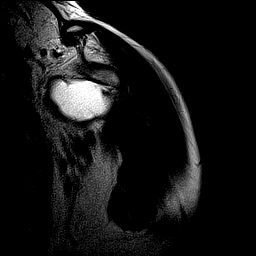
[im 3/18]
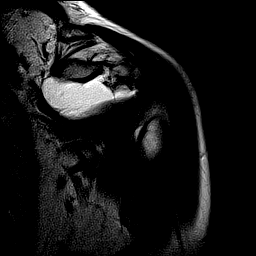
[im 6/18]
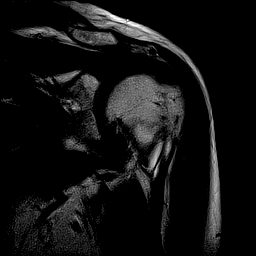
[im 9/18]
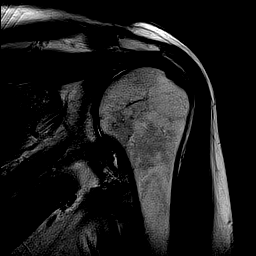
[im 12/18]
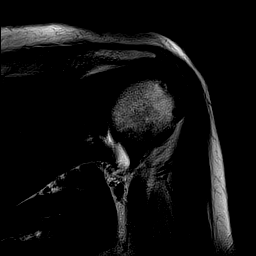
[im 15/18]
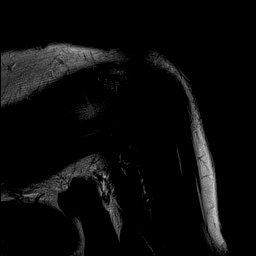
[im 18/18]
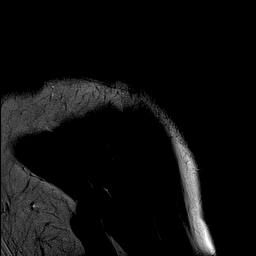

[40 of 40 positions shown; findings below may reference images not displayed]

FINDINGS: Despite efforts by the technologist and patient, motion artifact is
present on today's exam and could not be eliminated. This reduces
exam sensitivity and specificity.

Rotator cuff: Mild subcortical cystic change within the humeral head
deep to the supraspinatus and infraspinatus tendon insertions.
Minimal infraspinatus greater than supraspinatus intermediate T2
signal tendinosis. The subscapularis and teres minor are intact.

Muscles: No rotator cuff muscle atrophy, fatty infiltration, or
edema.

Biceps long head: There is moderate fluid within the biceps tendon
sheath which is slightly out of proportion to a mild glenohumeral
joint effusion suggesting mild tenosynovitis. There is a 7 mm low
signal loose body within the medial aspect of the biceps tendon
sheath at the level of the proximal humeral metadiaphysis (axial
image 16, sagittal image 10, coronal image 13). Minimal proximal
long head biceps tendinosis proximal to the bicipital groove.

Acromioclavicular Joint: There are mild degenerative changes of the
acromioclavicular joint including joint space narrowing, subchondral
marrow edema, and peripheral osteophytosis. Type II acromion.

Trace fluid within the subacromial/subdeltoid bursa.

Glenohumeral Joint: Diffuse high-grade partial to full-thickness
cartilage loss throughout the glenoid and humeral head. Moderate
glenoid diffuse subchondral marrow edema.

Labrum: Moderate to high-grade diffuse degenerative attenuation of
the posterior and superior aspects of the glenoid labrum.

Bones:  No acute fracture.

Other: Moderate fluid is seen within the subscapular recess.
IMPRESSION: 1. Minimal infraspinatus greater than supraspinatus tendinosis. No
rotator cuff tear.
2. Mild long head of the biceps tenosynovitis. Minimal proximal long
head of the biceps tendinosis.
3. Mild degenerative changes of the acromioclavicular joint.
4. Severe glenohumeral osteoarthritis. Small loose body within the
biceps tendon sheath.

## 2022-08-08 NOTE — Progress Notes (Signed)
I, Stevenson Clinch, CMA acting as a scribe for [name], MD.  Beverly Dougherty is a 62 y.o. female who presents to [facility] today for cont'd bilat shoulder pain. Of note, she underwent a L-3-4 lumbar fusion w/ Dr. Lovell Sheehan on May 16th. Pt was last seen by Dr. Denyse Amass on 02/20/22 and was given bilat GH steroid injections.  Today, pt reports planning for left shoulder replacement in Sept 2024. Pain and tightness in the deltoids. Sx are affected by sleeping. N/T hasn't been as bad recently. Left shoulder worse with twisting and lifting. Just back from funeral in [place], 4 hour drive each way.   Her preferred surgeon for her shoulder is Dr. Ave Filter  Dx imaging: 07/19/21 L shoulder MRI             07/06/21 L-spine XR             01/19/21 R & L shoulder XR  Pertinent review of systems: No fevers or chills  Relevant historical information: Rheumatoid arthritis   Exam:  BP 110/78   Pulse 98   Ht 5\' 4"  (1.626 m)   Wt 136 lb 6.4 oz (61.9 kg)   LMP 03/23/2015   SpO2 98%   BMI 23.41 kg/m  General: Well Developed, well nourished, and in no acute distress.   MSK: Bilateral shoulders normal appearing decreased range of motion.    Lab and Radiology Results  Procedure: Real-time Ultrasound Guided Injection of right shoulder glenohumeral joint posterior approach Device: Philips Affiniti 50G/GE Logiq Images permanently stored and available for review in PACS Verbal informed consent obtained.  Discussed risks and benefits of procedure. Warned about infection, bleeding, hyperglycemia damage to structures among others. Patient expresses understanding and agreement Time-out conducted.   Noted no overlying erythema, induration, or other signs of local infection.   Skin prepped in a sterile fashion.   Local anesthesia: Topical Ethyl chloride.   With sterile technique and under real time ultrasound guidance: 40 mg of Kenalog and 2 mL of Marcaine injected  into glenohumeral joint. Fluid seen entering the joint capsule.   Completed without difficulty   Pain immediately resolved suggesting accurate placement of the medication.   Advised to call if fevers/chills, erythema, induration, drainage, or persistent bleeding.   Images permanently stored and available for review in the ultrasound unit.  Impression: Technically successful ultrasound guided injection.    Procedure: Real-time Ultrasound Guided Injection of left shoulder glenohumeral joint posterior approach Device: Philips Affiniti 50G/GE Logiq Images permanently stored and available for review in PACS Verbal informed consent obtained.  Discussed risks and benefits of procedure. Warned about infection, bleeding, hyperglycemia damage to structures among others. Patient expresses understanding and agreement Time-out conducted.   Noted no overlying erythema, induration, or other signs of local infection.   Skin prepped in a sterile fashion.   Local anesthesia: Topical Ethyl chloride.   With sterile technique and under real time ultrasound guidance: 40 mg of Kenalog and 2 mL of Marcaine injected into shoulder joint. Fluid seen entering the joint capsule.   Completed without difficulty   Pain immediately resolved suggesting accurate placement of the medication.   Advised to call if fevers/chills, erythema, induration, drainage, or persistent bleeding.   Images permanently stored and available for review in the ultrasound unit.  Impression: Technically successful ultrasound guided injection.         Assessment and Plan: 62 y.o. female with bilateral shoulder pain left worse than right.  Pain  due to degenerative changes.  Ultimately she will require shoulder replacement.  She has already had consultations with Dr. Ave Filter and Dr. Steward Drone.  Would like to proceed with surgery with Dr. Ave Filter and is planning for September or October.  I recommend that she contact his office and get that set  up in the near future.  Often orthopedic surgeons want to wait at least 3 months after steroid injection before proceeding with a shoulder replacement.  This would be early October.  She thinks that works out just fine and will schedule with him soon.   PDMP not reviewed this encounter. Orders Placed This Encounter  Procedures   [place] LIMITED JOINT SPACE STRUCTURES UP BILAT(NO LINKED CHARGES)    Order Specific Question:   Reason for Exam (SYMPTOM  OR DIAGNOSIS REQUIRED)    Answer:   shoulder pain    Order Specific Question:   Preferred imaging location?    Answer:   [facility]   No orders of the defined types were placed in this encounter.    Discussed warning signs or symptoms. Please see discharge instructions. Patient expresses understanding.   The above documentation has been reviewed and is accurate and complete [name], M.D.

## 2022-08-09 ENCOUNTER — Ambulatory Visit: Payer: No Typology Code available for payment source | Admitting: Family Medicine

## 2022-08-09 ENCOUNTER — Encounter: Payer: Self-pay | Admitting: Family Medicine

## 2022-08-09 ENCOUNTER — Other Ambulatory Visit: Payer: Self-pay

## 2022-08-09 VITALS — BP 110/78 | HR 98 | Ht 64.0 in | Wt 136.4 lb

## 2022-08-09 DIAGNOSIS — M25512 Pain in left shoulder: Secondary | ICD-10-CM | POA: Diagnosis not present

## 2022-08-09 DIAGNOSIS — G8929 Other chronic pain: Secondary | ICD-10-CM

## 2022-08-09 DIAGNOSIS — M25511 Pain in right shoulder: Secondary | ICD-10-CM | POA: Diagnosis not present

## 2022-08-09 NOTE — Patient Instructions (Addendum)
Thank you for coming in today.   You received an injection today. Seek immediate medical attention if the joint becomes red, extremely painful, or is oozing fluid.  

## 2022-12-13 ENCOUNTER — Ambulatory Visit: Payer: No Typology Code available for payment source

## 2022-12-13 ENCOUNTER — Other Ambulatory Visit: Payer: Self-pay

## 2022-12-13 ENCOUNTER — Ambulatory Visit: Payer: No Typology Code available for payment source | Admitting: Family Medicine

## 2022-12-13 VITALS — BP 110/78 | HR 98 | Ht 64.0 in | Wt 138.0 lb

## 2022-12-13 DIAGNOSIS — G8929 Other chronic pain: Secondary | ICD-10-CM

## 2022-12-13 DIAGNOSIS — M542 Cervicalgia: Secondary | ICD-10-CM

## 2022-12-13 DIAGNOSIS — M25511 Pain in right shoulder: Secondary | ICD-10-CM | POA: Diagnosis not present

## 2022-12-13 NOTE — Patient Instructions (Addendum)
Thank you for coming in today.   You received an injection today. Seek immediate medical attention if the joint becomes red, extremely painful, or is oozing fluid.   Get schedule with Dr. Ave Filter  For neck: Since may doing Pt at [facility] since May

## 2022-12-13 NOTE — Progress Notes (Signed)
Beverly Payor, PhD, LAT, ATC acting as a scribe for Beverly Graham, MD.  Beverly Dougherty is a 62 y.o. female who presents to [facility] at [facility] today for cont'd R shoulder pain. Pt was last seen by Dr. Denyse Amass on 08/09/22 and was given bilat GH steroid injections and was adivsed to contact Dr. Veda Canning office to schedule surgery.  Today, pt reports R shoulder is very pain. +mechanical symptoms. R shoulder pain just returned over the last few wks.  Pain is severe enough that she is having to take some hydrocodone.  Aggravates: shoulder aBd, IR  She called today, for a visit to discuss a L shoulder replacement.   Her mother broke her hip few weeks ago.   Additionally she notes chronic neck pain.  This has been ongoing since May of this year.  She was seen by her neurosurgeon who attempted to order a cervical spine MRI but was unsuccessful.  Since then she has been doing home exercise program as directed by the neurosurgeon and attending physical therapy at [facility].  This has helped some but she continues to experience bilateral neck pain extending to the upper back.  Dx imaging: 07/19/21 L shoulder MRI             01/19/21 R & L shoulder XR  Pertinent review of systems: No fevers or chills  Relevant historical information: Polyarthralgia   Exam:  BP 110/78   Pulse 98   Ht 5\' 4"  (1.626 m)   Wt 138 lb (62.6 kg)   LMP 03/23/2015   SpO2 98%   BMI 23.69 kg/m  General: Well Developed, well nourished, and in no acute distress.   MSK: Right shoulder normal-appearing Decreased range of motion. Intact strength.  C-spine decreased cervical motion.  Operatory strength is intact.  Lab and Radiology Results  Procedure: Real-time Ultrasound Guided Injection of right shoulder glenohumeral joint posterior approach Device: Philips Affiniti 50G/GE Logiq Images permanently stored and available for review in PACS Verbal informed consent obtained.  Discussed risks  and benefits of procedure. Warned about infection, bleeding, hyperglycemia damage to structures among others. Patient expresses understanding and agreement Time-out conducted.   Noted no overlying erythema, induration, or other signs of local infection.   Skin prepped in a sterile fashion.   Local anesthesia: Topical Ethyl chloride.   With sterile technique and under real time ultrasound guidance: 40 mg of Kenalog and 2 ml of Marcaine injected into glenohumeral joint. Fluid seen entering the joint capsule.   Completed without difficulty   Pain immediately resolved suggesting accurate placement of the medication.   Advised to call if fevers/chills, erythema, induration, drainage, or persistent bleeding.   Images permanently stored and available for review in the ultrasound unit.  Impression: Technically successful ultrasound guided injection.    X-ray images C-spine obtained today personally and independently interpreted Multilevel degenerative changes worse at C5-6.  Fusion present at C6-7.  No acute fractures are visible. Await formal radiology review    Assessment and Plan: 62 y.o. 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  Plan for intra-articular right glenohumeral joint injection.  As for the left shoulder she is already called Dr. Ave Filter and will be scheduling her replacement surgery in the near future.  Additionally she has chronic neck pain thought to be due to degenerative changes.  She is already had a great trial of physical therapy over the last several months.  Conservative treatment has been  ongoing since May of this year.  Plan for x-ray and MRI.   PDMP not reviewed this encounter. Orders Placed This Encounter  Procedures   [place] LIMITED JOINT SPACE STRUCTURES UP RIGHT(NO LINKED CHARGES)    Order Specific Question:   Reason for Exam (SYMPTOM  OR DIAGNOSIS REQUIRED)    Answer:   right shoulder pain    Order Specific Question:   Preferred imaging  location?    Answer:   [facility] Cervical Spine 2 or 3 views    Standing Status:   Future    Number of Occurrences:   1    Standing Expiration Date:   12/13/2023    Order Specific Question:   Reason for Exam (SYMPTOM  OR DIAGNOSIS REQUIRED)    Answer:   eval cervical rad    Order Specific Question:   Is patient pregnant?    Answer:   No    Order Specific Question:   Preferred imaging location?    Answer:   [facility]   MR CERVICAL SPINE WO CONTRAST    Standing Status:   Future    Standing Expiration Date:   12/13/2023    Order Specific Question:   What is the patient's sedation requirement?    Answer:   No Sedation    Order Specific Question:   Does the patient have a pacemaker or implanted devices?    Answer:   No    Order Specific Question:   Preferred imaging location?    Answer:   [facility] (table limit-550lbs)   No orders of the defined types were placed in this encounter.    Discussed warning signs or symptoms. Please see discharge instructions. Patient expresses understanding.   The above documentation has been reviewed and is accurate and complete Beverly Dougherty, M.D.

## 2022-12-18 NOTE — Progress Notes (Signed)
Cervical spine x-ray shows some arthritis changes.

## 2022-12-26 ENCOUNTER — Other Ambulatory Visit (HOSPITAL_BASED_OUTPATIENT_CLINIC_OR_DEPARTMENT_OTHER): Payer: Self-pay | Admitting: Family Medicine

## 2022-12-26 DIAGNOSIS — E782 Mixed hyperlipidemia: Secondary | ICD-10-CM

## 2023-01-01 ENCOUNTER — Encounter (HOSPITAL_COMMUNITY): Payer: Self-pay

## 2023-01-01 ENCOUNTER — Ambulatory Visit (HOSPITAL_COMMUNITY): Payer: Commercial Managed Care - HMO | Attending: Family Medicine

## 2023-04-24 ENCOUNTER — Telehealth: Payer: Self-pay | Admitting: Family Medicine

## 2023-04-24 NOTE — Telephone Encounter (Signed)
 Forwarding to Dr. Denyse Amass to review and advise if OK to proceed with injection for RIGHT shoulder.

## 2023-04-24 NOTE — Telephone Encounter (Signed)
 Patient called and stated she is having surgery on her left shoulder on Tuesday. Patient is wanting to have an injection in the right shoulder. She states she had it approved by Dr. Ave Filter for the injection in right shoulder prior to her surgery.  I wanted to send this over to Dr. Denyse Amass and wanted to confirm this injection. She is scheduled for Thursday (3/20).

## 2023-04-25 NOTE — Telephone Encounter (Signed)
Patient informed.  She will call back to schedule.

## 2023-04-25 NOTE — Progress Notes (Unsigned)
   Rubin Payor, PhD, LAT, ATC acting as a scribe for [name], MD.  Daun Peacock is a 63 y.o. female who presents to [facility] at [facility] today for cont'd R shoulder pain. Pt is scheduled to have L shoulder surgery on Tuesday.   Dx imaging: 07/19/21 L shoulder MRI             01/19/21 R & L shoulder XR  Pertinent review of systems: ***  Relevant historical information: ***   Exam:  LMP 03/23/2015  General: Well Developed, well nourished, and in no acute distress.   MSK: ***    Lab and Radiology Results No results found for this or any previous visit (from the past 72 hours). No results found.     Assessment and Plan: 63 y.o. female with ***   PDMP not reviewed this encounter. No orders of the defined types were placed in this encounter.  No orders of the defined types were placed in this encounter.    Discussed warning signs or symptoms. Please see discharge instructions. Patient expresses understanding.   ***

## 2023-04-25 NOTE — Telephone Encounter (Signed)
 Okay to do an injection on the right shoulder if she is having left shoulder surgery tomorrow.  Okay to schedule.

## 2023-04-25 NOTE — Telephone Encounter (Signed)
 Please reach out to pt to assist schedule visit for shoulder injection.   Thank you!

## 2023-04-26 ENCOUNTER — Ambulatory Visit (INDEPENDENT_AMBULATORY_CARE_PROVIDER_SITE_OTHER): Admitting: Family Medicine

## 2023-04-26 ENCOUNTER — Other Ambulatory Visit: Payer: Self-pay

## 2023-04-26 VITALS — BP 126/88 | Ht 64.0 in

## 2023-04-26 DIAGNOSIS — M25511 Pain in right shoulder: Secondary | ICD-10-CM

## 2023-04-26 DIAGNOSIS — G8929 Other chronic pain: Secondary | ICD-10-CM | POA: Diagnosis not present

## 2023-04-26 DIAGNOSIS — H409 Unspecified glaucoma: Secondary | ICD-10-CM | POA: Insufficient documentation

## 2023-04-26 NOTE — Patient Instructions (Addendum)
 Thank you for coming in today.   You received an injection today. Seek immediate medical attention if the joint becomes red, extremely painful, or is oozing fluid.   Wishing you a speedy recovery!!  Talk to your eye doctor about glaucoma and steroids.

## 2023-06-04 ENCOUNTER — Encounter: Payer: Self-pay | Admitting: Pediatrics

## 2023-07-04 ENCOUNTER — Ambulatory Visit (AMBULATORY_SURGERY_CENTER)

## 2023-07-04 VITALS — Ht 64.0 in | Wt 130.0 lb

## 2023-07-04 DIAGNOSIS — Z1211 Encounter for screening for malignant neoplasm of colon: Secondary | ICD-10-CM

## 2023-07-04 MED ORDER — NA SULFATE-K SULFATE-MG SULF 17.5-3.13-1.6 GM/177ML PO SOLN
1.0000 | Freq: Once | ORAL | 0 refills | Status: AC
Start: 2023-07-04 — End: 2023-07-04

## 2023-07-04 NOTE — Progress Notes (Signed)

## 2023-07-09 ENCOUNTER — Encounter: Payer: Self-pay | Admitting: Pediatrics

## 2023-07-16 NOTE — Progress Notes (Unsigned)
 [facility] History and Physical   Primary Care Physician:  Candise Chambers, MD   Reason for Procedure:  Colorectal cancer screening  Plan:    Colonoscopy     HPI: Beverly Dougherty is a 63 y.o. female undergoing screening colonoscopy for colorectal cancer screening.  Patient underwent colonoscopy in 2015 that disclosed 2 lesions that were thought to be polyps but turned out to be lymphoid aggregates on histopathology.  No family history of colorectal cancer or polyps.  Patient denies change in bowel habits or rectal bleeding at the time of this exam.   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 Authorizing Provider  ALPRAZolam  (XANAX ) 1 MG tablet Take 1 mg by mouth at bedtime.    [provider]  amphetamine -dextroamphetamine (ADDERALL XR) 15 MG 24 hr capsule Take 15 mg by mouth 2 (two) times daily. 08/31/20   [provider]  Ascorbic Acid (VITAMIN C) 1000 MG tablet Take 1,000 mg by mouth daily. Patient not taking: Reported on 07/04/2023    [provider]  Brimonidine Tartrate (LUMIFY) 0.025 % SOLN Place 1 drop into both eyes daily as needed (Five times a week).    [provider]  cetirizine (ZYRTEC) 10 MG tablet Take 10 mg by mouth daily.    [provider]  cyclobenzaprine  (FLEXERIL ) 10 MG tablet Take 1 tablet (10 mg total) by mouth 3  (three) times daily as needed for muscle spasms. Patient not taking: Reported on 07/04/2023 06/23/22   Bergman, Meghan D, NP  docusate sodium  (COLACE) 100 MG capsule Take 1 capsule (100 mg total) by mouth 2 (two) times daily. Patient not taking: Reported on 07/04/2023 06/23/22   Bergman, Meghan D, NP  dorzolamide-timolol (COSOPT) 2-0.5 % ophthalmic solution Place 1 drop into the left eye 2 (two) times daily.    [provider]  HYDROcodone -acetaminophen  (NORCO/VICODIN) 5-325 MG tablet Take 1 tablet by mouth every 6 (six) hours as needed. for pain 07/18/22   [provider]  hydroxychloroquine  (PLAQUENIL ) 200 MG tablet Take 200 mg by mouth at bedtime.    [provider]  irbesartan  (AVAPRO ) 150 MG tablet Take 150 mg by mouth every morning.    [provider]  latanoprost (XALATAN) 0.005 % ophthalmic solution Place 1 drop into the left eye at bedtime.    [provider]  meloxicam  (MOBIC ) 15 MG tablet Take 1 tablet (15 mg total) by mouth daily. Can restart in two weeks 06/23/22   Bergman, Meghan D, NP  Multiple Vitamins-Minerals (MULTIVITAMIN WITH MINERALS) tablet Take 1 tablet by mouth daily. Silver +    [provider]  pregabalin  (LYRICA ) 50 MG capsule Take 50 mg by mouth at bedtime.  [provider]  Propylene Glycol (SYSTANE COMPLETE) 0.6 % SOLN Place 1 drop into both eyes daily as needed (Dry eyes).    [provider]    Current Outpatient Medications  Medication Sig Dispense Refill   ALPRAZolam  (XANAX ) 1 MG tablet Take 1 mg by mouth at bedtime.     amphetamine -dextroamphetamine (ADDERALL XR) 15 MG 24 hr capsule Take 15 mg by mouth 2 (two) times daily.     docusate sodium  (COLACE) 100 MG capsule Take 1 capsule (100 mg total) by mouth 2 (two) times daily. 10 capsule 0   dorzolamide-timolol (COSOPT) 2-0.5 % ophthalmic solution Place 1 drop into the left eye 2 (two) times daily.     HYDROcodone -acetaminophen  (NORCO/VICODIN)  5-325 MG tablet Take 1 tablet by mouth every 6 (six) hours as needed. for pain     irbesartan  (AVAPRO ) 150 MG tablet Take 150 mg by mouth every morning.     latanoprost (XALATAN) 0.005 % ophthalmic solution Place 1 drop into the left eye at bedtime.     Multiple Vitamins-Minerals (MULTIVITAMIN WITH MINERALS) tablet Take 1 tablet by mouth daily. Silver +     pregabalin  (LYRICA ) 50 MG capsule Take 50 mg by mouth at bedtime.     Propylene Glycol (SYSTANE COMPLETE) 0.6 % SOLN Place 1 drop into both eyes daily as needed (Dry eyes).     cetirizine (ZYRTEC) 10 MG tablet Take 10 mg by mouth daily.     meloxicam  (MOBIC ) 15 MG tablet Take 1 tablet (15 mg total) by mouth daily. Can restart in two weeks     Current Facility-Administered Medications  Medication Dose Route Frequency Provider Last Rate Last Admin   0.9 %  sodium chloride  infusion  500 mL Intravenous Continuous Antwan Bribiesca, Scarlette Currier, MD        Allergies as of 07/18/2023 - Review Complete 07/18/2023  Allergen Reaction Noted   Brimonidine Itching, Swelling, and Dermatitis 06/20/2023    Family History  Problem Relation Age of Onset   Colon cancer Neg Hx    Rectal cancer Neg Hx    Stomach cancer Neg Hx     Social History   Socioeconomic History   Marital status: Married    Spouse name: Not on file   Number of children: 1   Years of education: 14   Highest education level: Not on file  Occupational History   Not on file  Tobacco Use   Smoking status: Former    Current packs/day: 0.00    Average packs/day: 0.5 packs/day for 5.0 years (2.5 ttl pk-yrs)    Types: Cigarettes    Start date: 09/26/1979    Quit date: 09/25/1984    Years since quitting: 38.8   Smokeless tobacco: Never  Vaping Use   Vaping status: Never Used  Substance and Sexual Activity   Alcohol use: Yes    Alcohol/week: 4.0 standard drinks of alcohol    Types: 4 Glasses of wine per week   Drug use: No   Sexual activity: Not on file  Other Topics Concern   Not on  file  Social History Narrative   Right handed   Drinks caffeine   Two story home   1 biological child, 1 stepchild   Social Drivers of Corporate investment banker Strain: Not on file  Food Insecurity: Not on file  Transportation Needs: Not on file  Physical Activity: Not on file  Stress: Not on file  Social Connections: Not on file  Intimate Partner Violence:  Not on file    Review of Systems:  All other review of systems negative except as mentioned in the HPI.  Physical Exam: Vital signs BP 107/77   Temp 98.1 F (36.7 C)   Ht 5' 4 (1.626 m)   Wt 130 lb (59 kg)   LMP 03/23/2015   BMI 22.31 kg/m   General:   Alert,  Well-developed, well-nourished, pleasant and cooperative in NAD Airway:  Mallampati 2 Lungs:  Clear throughout to auscultation.   Heart:  Regular rate and rhythm; no murmurs, clicks, rubs,  or gallops. Abdomen:  Soft, nontender and nondistended. Normal bowel sounds.   Neuro/Psych:  Normal mood and affect. A and O x 3  Eugenia Hess, MD [facility]

## 2023-07-18 ENCOUNTER — Encounter: Payer: Self-pay | Admitting: Pediatrics

## 2023-07-18 ENCOUNTER — Ambulatory Visit (AMBULATORY_SURGERY_CENTER): Admitting: Pediatrics

## 2023-07-18 VITALS — BP 138/88 | HR 65 | Temp 98.1°F | Resp 16 | Ht 64.0 in | Wt 130.0 lb

## 2023-07-18 DIAGNOSIS — K644 Residual hemorrhoidal skin tags: Secondary | ICD-10-CM

## 2023-07-18 DIAGNOSIS — K6389 Other specified diseases of intestine: Secondary | ICD-10-CM

## 2023-07-18 DIAGNOSIS — K648 Other hemorrhoids: Secondary | ICD-10-CM | POA: Diagnosis not present

## 2023-07-18 DIAGNOSIS — Z1211 Encounter for screening for malignant neoplasm of colon: Secondary | ICD-10-CM

## 2023-07-18 DIAGNOSIS — D125 Benign neoplasm of sigmoid colon: Secondary | ICD-10-CM

## 2023-07-18 MED ORDER — SODIUM CHLORIDE 0.9 % IV SOLN
500.0000 mL | INTRAVENOUS | Status: AC
Start: 2023-07-18 — End: 2023-07-19

## 2023-07-18 NOTE — Patient Instructions (Signed)

## 2023-07-18 NOTE — Progress Notes (Signed)
 Called to room to assist during endoscopic procedure.  Patient ID and intended procedure confirmed with present staff. Received instructions for my participation in the procedure from the performing physician.

## 2023-07-18 NOTE — Progress Notes (Signed)
 Pt's states no medical or surgical changes since previsit or office visit.

## 2023-07-18 NOTE — Op Note (Signed)
 [facility] Patient Name: Beverly Dougherty Procedure Date: 07/18/2023 10:35 AM MRN: [id number] Endoscopist: Eugenia Hess , MD, [id number] Age: 63 Referring MD:  Date of Birth: 1960/07/20 Gender: Female Account #: [id number] Procedure:                Colonoscopy Indications:              Screening for colorectal malignant neoplasm, Last                            colonoscopy: 2015 Medicines:                Monitored Anesthesia Care Procedure:                Pre-Anesthesia Assessment:                           - Prior to the procedure, a History and Physical                            was performed, and patient medications and                            allergies were reviewed. The patient's tolerance of                            previous anesthesia was also reviewed. The risks                            and benefits of the procedure and the sedation                            options and risks were discussed with the patient.                            All questions were answered, and informed consent                            was obtained. Prior Anticoagulants: The patient has                            taken no anticoagulant or antiplatelet agents. ASA                            Grade Assessment: II - A patient with mild systemic                            disease. After reviewing the risks and benefits,                            the patient was deemed in satisfactory condition to                            undergo the procedure.  After obtaining informed consent, the colonoscope                            was passed under direct vision. Throughout the                            procedure, the patient's blood pressure, pulse, and                            oxygen saturations were monitored continuously. The                            Olympus Scope SN [id number] was introduced through the                            anus and advanced to the cecum,  identified by                            appendiceal orifice and ileocecal valve. The                            colonoscopy was performed without difficulty. The                            patient tolerated the procedure well. The quality                            of the bowel preparation was adequate. The                            ileocecal valve, appendiceal orifice, and rectum                            were photographed. Scope In: 10:36:10 AM Scope Out: 10:55:38 AM Scope Withdrawal Time: 0 hours 15 minutes 41 seconds  Total Procedure Duration: 0 hours 19 minutes 28 seconds  Findings:                 Hemorrhoids were found on perianal exam.                           A small amount of semi-solid stool was found in the                            entire colon. Lavage of the area was performed                            using a large amount of sterile water, resulting in                            clearance with adequate visualization.                           A 3 mm polyp was found in the sigmoid colon. The  polyp was sessile. The polyp was removed with a                            cold biopsy forceps. Resection and retrieval were                            complete.                           Internal hemorrhoids were found during retroflexion. Complications:            No immediate complications. Estimated blood loss:                            Minimal. Estimated Blood Loss:     Estimated blood loss was minimal. Impression:               - Hemorrhoids found on perianal exam.                           - Stool in the entire examined colon.                           - One 3 mm polyp in the sigmoid colon, removed with                            a cold biopsy forceps. Resected and retrieved.                           - Internal hemorrhoids. Recommendation:           - Discharge patient to home (ambulatory).                           - Await pathology results.                            - Repeat colonoscopy for surveillance based on                            pathology results.                           - The findings and recommendations were discussed                            with the patient's family.                           - Return to referring physician.                           - Patient has a contact number available for                            emergencies. The signs and symptoms of potential  delayed complications were discussed with the                            patient. Return to normal activities tomorrow.                            Written discharge instructions were provided to the                            patient. Eugenia Hess, MD 07/18/2023 11:00:08 AM This report has been signed electronically.

## 2023-07-21 ENCOUNTER — Ambulatory Visit: Payer: Self-pay | Admitting: Pediatrics

## 2023-09-19 ENCOUNTER — Other Ambulatory Visit: Payer: Self-pay | Admitting: Family Medicine

## 2023-09-19 ENCOUNTER — Ambulatory Visit
Admission: RE | Admit: 2023-09-19 | Discharge: 2023-09-19 | Disposition: A | Discharge status: Home / Self Care | Source: Ambulatory Visit | Attending: Family Medicine | Admitting: Family Medicine

## 2023-09-19 DIAGNOSIS — R053 Chronic cough: Secondary | ICD-10-CM

## 2023-12-26 ENCOUNTER — Other Ambulatory Visit: Payer: Self-pay | Admitting: Family Medicine

## 2023-12-26 DIAGNOSIS — R062 Wheezing: Secondary | ICD-10-CM

## 2023-12-26 DIAGNOSIS — R0989 Other specified symptoms and signs involving the circulatory and respiratory systems: Secondary | ICD-10-CM

## 2023-12-26 DIAGNOSIS — R058 Other specified cough: Secondary | ICD-10-CM

## 2023-12-26 DIAGNOSIS — R042 Hemoptysis: Secondary | ICD-10-CM

## 2024-01-10 ENCOUNTER — Ambulatory Visit
Admission: RE | Admit: 2024-01-10 | Discharge: 2024-01-10 | Disposition: A | Source: Ambulatory Visit | Attending: Family Medicine | Admitting: Family Medicine

## 2024-01-10 DIAGNOSIS — R042 Hemoptysis: Secondary | ICD-10-CM

## 2024-01-10 DIAGNOSIS — R058 Other specified cough: Secondary | ICD-10-CM

## 2024-01-10 DIAGNOSIS — R0989 Other specified symptoms and signs involving the circulatory and respiratory systems: Secondary | ICD-10-CM

## 2024-01-10 DIAGNOSIS — R062 Wheezing: Secondary | ICD-10-CM
